# Patient Record
Sex: Female | Born: 1970 | Race: Black or African American | Hispanic: No | Marital: Married | Smoking: Former smoker
Health system: Southern US, Community
[De-identification: ages and names within clinical notes are randomized; demographics above are authoritative.]

## PROBLEM LIST (undated history)

## (undated) DIAGNOSIS — I10 Essential (primary) hypertension: Secondary | ICD-10-CM

---

## 2005-07-10 ENCOUNTER — Emergency Department (HOSPITAL_COMMUNITY): Admission: EM | Admit: 2005-07-10 | Discharge: 2005-07-10 | Payer: Self-pay | Admitting: Emergency Medicine

## 2005-09-10 ENCOUNTER — Emergency Department (HOSPITAL_COMMUNITY): Admission: EM | Admit: 2005-09-10 | Discharge: 2005-09-10 | Payer: Self-pay | Admitting: Emergency Medicine

## 2008-04-01 ENCOUNTER — Emergency Department (HOSPITAL_COMMUNITY): Admission: EM | Admit: 2008-04-01 | Discharge: 2008-04-01 | Payer: Self-pay | Admitting: *Deleted

## 2008-06-19 ENCOUNTER — Emergency Department (HOSPITAL_COMMUNITY): Admission: EM | Admit: 2008-06-19 | Discharge: 2008-06-19 | Payer: Self-pay | Admitting: Emergency Medicine

## 2008-08-01 ENCOUNTER — Observation Stay (HOSPITAL_COMMUNITY): Admission: EM | Admit: 2008-08-01 | Discharge: 2008-08-02 | Payer: Self-pay | Admitting: Emergency Medicine

## 2008-10-18 ENCOUNTER — Emergency Department (HOSPITAL_COMMUNITY): Admission: EM | Admit: 2008-10-18 | Discharge: 2008-10-18 | Payer: Self-pay | Admitting: Emergency Medicine

## 2008-12-14 ENCOUNTER — Emergency Department (HOSPITAL_COMMUNITY): Admission: EM | Admit: 2008-12-14 | Discharge: 2008-12-14 | Payer: Self-pay | Admitting: Emergency Medicine

## 2009-06-16 ENCOUNTER — Emergency Department (HOSPITAL_COMMUNITY): Admission: EM | Admit: 2009-06-16 | Discharge: 2009-06-16 | Payer: Self-pay | Admitting: Emergency Medicine

## 2009-06-18 ENCOUNTER — Emergency Department (HOSPITAL_COMMUNITY): Admission: EM | Admit: 2009-06-18 | Discharge: 2009-06-18 | Payer: Self-pay | Admitting: Emergency Medicine

## 2010-05-19 LAB — DIFFERENTIAL
Basophils Relative: 0 % (ref 0–1)
Eosinophils Absolute: 0.2 10*3/uL (ref 0.0–0.7)
Eosinophils Relative: 5 % (ref 0–5)
Lymphs Abs: 1.3 10*3/uL (ref 0.7–4.0)
Monocytes Absolute: 0.3 10*3/uL (ref 0.1–1.0)
Monocytes Relative: 7 % (ref 3–12)

## 2010-05-19 LAB — CBC
HCT: 35.8 % — ABNORMAL LOW (ref 36.0–46.0)
Hemoglobin: 11.7 g/dL — ABNORMAL LOW (ref 12.0–15.0)
MCHC: 32.5 g/dL (ref 30.0–36.0)
MCV: 84.3 fL (ref 78.0–100.0)
RBC: 4.25 MIL/uL (ref 3.87–5.11)
WBC: 4.2 10*3/uL (ref 4.0–10.5)

## 2010-05-19 LAB — POCT I-STAT, CHEM 8
Calcium, Ion: 1.15 mmol/L (ref 1.12–1.32)
Creatinine, Ser: 0.8 mg/dL (ref 0.4–1.2)
Glucose, Bld: 96 mg/dL (ref 70–99)
HCT: 38 % (ref 36.0–46.0)
Hemoglobin: 12.9 g/dL (ref 12.0–15.0)
Potassium: 3.6 mEq/L (ref 3.5–5.1)
TCO2: 26 mmol/L (ref 0–100)

## 2010-05-19 LAB — POCT CARDIAC MARKERS
CKMB, poc: 2 ng/mL (ref 1.0–8.0)
CKMB, poc: 2.2 ng/mL (ref 1.0–8.0)
Myoglobin, poc: 75.2 ng/mL (ref 12–200)

## 2010-05-20 LAB — GLUCOSE, CAPILLARY: Glucose-Capillary: 106 mg/dL — ABNORMAL HIGH (ref 70–99)

## 2010-05-27 LAB — CBC
MCHC: 34 g/dL (ref 30.0–36.0)
MCV: 85.3 fL (ref 78.0–100.0)
Platelets: 367 10*3/uL (ref 150–400)
RDW: 11.8 % (ref 11.5–15.5)

## 2010-05-27 LAB — URINALYSIS, ROUTINE W REFLEX MICROSCOPIC
Bilirubin Urine: NEGATIVE
Glucose, UA: NEGATIVE mg/dL
Nitrite: NEGATIVE
Specific Gravity, Urine: 1.022 (ref 1.005–1.030)
pH: 5.5 (ref 5.0–8.0)

## 2010-05-27 LAB — DIFFERENTIAL
Basophils Absolute: 0 10*3/uL (ref 0.0–0.1)
Basophils Relative: 0 % (ref 0–1)
Eosinophils Absolute: 0 10*3/uL (ref 0.0–0.7)
Neutrophils Relative %: 86 % — ABNORMAL HIGH (ref 43–77)

## 2010-05-27 LAB — URINE MICROSCOPIC-ADD ON

## 2010-05-27 LAB — BASIC METABOLIC PANEL
BUN: 6 mg/dL (ref 6–23)
CO2: 22 mEq/L (ref 19–32)
Chloride: 102 mEq/L (ref 96–112)
Creatinine, Ser: 1.02 mg/dL (ref 0.4–1.2)

## 2011-05-26 ENCOUNTER — Encounter (HOSPITAL_COMMUNITY): Payer: Self-pay | Admitting: *Deleted

## 2011-05-26 ENCOUNTER — Observation Stay (HOSPITAL_COMMUNITY)
Admission: EM | Admit: 2011-05-26 | Discharge: 2011-05-27 | Discharge status: Against Medical Advice | Payer: Self-pay | Source: Ambulatory Visit | Attending: Emergency Medicine | Admitting: Emergency Medicine

## 2011-05-26 ENCOUNTER — Emergency Department (HOSPITAL_COMMUNITY): Payer: Self-pay

## 2011-05-26 DIAGNOSIS — R0602 Shortness of breath: Secondary | ICD-10-CM | POA: Insufficient documentation

## 2011-05-26 DIAGNOSIS — I10 Essential (primary) hypertension: Secondary | ICD-10-CM | POA: Insufficient documentation

## 2011-05-26 DIAGNOSIS — R079 Chest pain, unspecified: Principal | ICD-10-CM | POA: Insufficient documentation

## 2011-05-26 HISTORY — DX: Essential (primary) hypertension: I10

## 2011-05-26 LAB — BASIC METABOLIC PANEL
GFR calc Af Amer: >90 mL/min (ref >90)
GFR calc non Af Amer: >90 mL/min (ref >90)
Glucose, Bld: 87 mg/dL (ref 70–99)
Potassium: 4.2 mEq/L (ref 3.5–5.1)
Sodium: 138 mEq/L (ref 135–145)

## 2011-05-26 LAB — POCT I-STAT TROPONIN I
Troponin i, poc: 0 ng/mL (ref 0.00–0.08)
Troponin i, poc: 0 ng/mL (ref 0.00–0.08)

## 2011-05-26 LAB — CBC
Hemoglobin: 10.4 g/dL — ABNORMAL LOW (ref 12.0–15.0)
MCHC: 31.3 g/dL (ref 30.0–36.0)
RDW: 18.9 % — ABNORMAL HIGH (ref 11.5–15.5)

## 2011-05-26 LAB — D-DIMER, QUANTITATIVE: D-Dimer, Quant: 0.65 ug/mL-FEU — ABNORMAL HIGH (ref 0.00–0.48)

## 2011-05-26 MED ORDER — NITROGLYCERIN 0.4 MG SL SUBL
0.4000 mg | SUBLINGUAL_TABLET | SUBLINGUAL | Status: DC | PRN
  Administered 2011-05-26: 0.4 mg via SUBLINGUAL

## 2011-05-26 MED ORDER — NITROGLYCERIN 0.4 MG SL SUBL
0.4000 mg | SUBLINGUAL_TABLET | SUBLINGUAL | Status: DC | PRN
  Administered 2011-05-26: 0.4 mg via SUBLINGUAL
  Filled 2011-05-26 (×2): qty 25

## 2011-05-26 MED ORDER — ACETAMINOPHEN 325 MG PO TABS: 650.0000 mg | ORAL_TABLET | Freq: Four times a day (QID) | ORAL | Status: DC | PRN

## 2011-05-26 MED ORDER — IOHEXOL 350 MG/ML SOLN: 80.0000 mL | Freq: Once | INTRAVENOUS | Status: AC | PRN

## 2011-05-26 MED ORDER — ZOLPIDEM TARTRATE 5 MG PO TABS
5.0000 mg | ORAL_TABLET | Freq: Every evening | ORAL | Status: DC | PRN
  Administered 2011-05-26: 5 mg via ORAL
  Filled 2011-05-26: qty 1

## 2011-05-26 MED ORDER — ACETAMINOPHEN 325 MG PO TABS
650.0000 mg | ORAL_TABLET | Freq: Once | ORAL | Status: AC
  Administered 2011-05-26: 650 mg via ORAL
  Filled 2011-05-26: qty 2

## 2011-05-26 MED ORDER — NITROGLYCERIN 2 % TD OINT
1.0000 [in_us] | TOPICAL_OINTMENT | Freq: Once | TRANSDERMAL | Status: AC
  Administered 2011-05-26: 1 [in_us] via TOPICAL
  Filled 2011-05-26: qty 1

## 2011-05-26 MED ORDER — ASPIRIN 81 MG PO CHEW
324.0000 mg | CHEWABLE_TABLET | Freq: Once | ORAL | Status: DC
  Filled 2011-05-26: qty 4

## 2011-05-26 NOTE — ED Provider Notes (Signed)
Medical screening examination/treatment/procedure(s) were conducted as a shared visit with non-physician practitioner(s) and myself.  I personally evaluated the patient during the encounter. Patient with onset of chest pain this morning. Patient reports she's been worked up in the past in [place] and was recommended to have a cath. In our notes she actually has had coronary CT done that was read as negative 3 years ago. Her d-dimer is slightly elevated we'll get CT angiogram chest. If it CT is negative we'll get stress test on cdu chest pain protocol. I do not feel patient is high risk for significant ACS  Olivia Mackie, MD 05/26/11 [phone]

## 2011-05-26 NOTE — ED Notes (Signed)
Pt c/o CP since 12:30 this evening after doing some house work then taking a shower.  Reports no recent illnesses but a higher level of stress.  No n/v, diaphoresis, cough.  Pain is in left chest and does not increase with palpation.  No SOB, but pain does worsen with inspiration.  Given 324 mg ASA and 3 sl nitro by EMS.

## 2011-05-26 NOTE — ED Notes (Signed)
Heart Healthy

## 2011-05-26 NOTE — Progress Notes (Signed)
Patient has been referred to the community liaison of [facility]) for medical resource assistance. This patient has also been discussed with the Medical Advisor.

## 2011-05-26 NOTE — ED Notes (Signed)
Patient transported to X-ray 

## 2011-05-26 NOTE — ED Provider Notes (Signed)
History     CSN: [id number]  Arrival date & time 05/26/11  0503  6:28 AM HPI Pt reports CP began about 3 hours ago. States it felt like angina. Describes it as left sided, and heavy. Reports she was getting ready for bed and had just laid down. Associated with SOB with exertion. Denies nausea, MI, PE. Currently 5-6/10; worst pain is 10/10. States pain has been constant for 3 hours. States her dr in [place] wanted her to have a cath but patient moved. Patient states she does not know why he wanted to do a cath. PCP DR. Avbuere.  Patient is a 41 y.o. female presenting with chest pain. The history is provided by the patient.  Chest Pain The chest pain began 3 - 5 hours ago. Chest pain occurs constantly. The chest pain is improving. At its most intense, the pain is at 10/10. The pain is currently at 5/10. The severity of the pain is severe. The quality of the pain is described as heavy. The pain does not radiate. Chest pain is worsened by deep breathing. Primary symptoms include shortness of breath. Pertinent negatives for primary symptoms include no fever, no fatigue, no cough, no wheezing, no abdominal pain, no nausea, no vomiting and no dizziness.  Pertinent negatives for associated symptoms include no claudication, no diaphoresis, no lower extremity edema, no near-syncope, no numbness, no orthopnea and no weakness. She tried nitroglycerin for the symptoms.  Her past medical history is significant for hypertension.  Pertinent negatives for past medical history include no CAD, no cancer, no COPD, no CHF, no DVT, no hyperlipidemia, no MI, no PE and no TIA.  Pertinent negatives for family medical history include: no early MI in family.  Procedure history is negative for cardiac catheterization and stress echo.     Past Medical History  Diagnosis Date  . Angina pectoris   . Hypertension     History reviewed. No pertinent past surgical history.  History reviewed. No pertinent family  history.  History  Substance Use Topics  . Smoking status: Not on file  . Smokeless tobacco: Not on file  . Alcohol Use:     OB History    Grav Para Term Preterm Abortions TAB SAB Ect Mult Living                  Review of Systems  Constitutional: Negative for fever, diaphoresis and fatigue.  HENT: Negative for neck pain.   Respiratory: Positive for shortness of breath. Negative for cough and wheezing.   Cardiovascular: Positive for chest pain. Negative for orthopnea, claudication, leg swelling and near-syncope.  Gastrointestinal: Negative for nausea, vomiting and abdominal pain.  Musculoskeletal: Negative for myalgias and back pain.  Neurological: Negative for dizziness, weakness, numbness and headaches.  All other systems reviewed and are negative.    Allergies  Review of patient's allergies indicates no known allergies.  Home Medications   Current Outpatient Rx  Name Route Sig Dispense Refill  . LISINOPRIL 10 MG PO TABS Oral Take 10 mg by mouth daily.    Marland Kitchen NITROGLYCERIN 0.4 MG SL SUBL Sublingual Place 0.4 mg under the tongue every 5 (five) minutes as needed.      BP 138/76  Pulse 85  Temp(Src) 97.8 F (36.6 C) (Oral)  Resp 18  SpO2 100%  Physical Exam  Vitals reviewed. Constitutional: She is oriented to person, place, and time. Vital signs are normal. She appears well-developed and well-nourished. No distress.  HENT:  Head: Normocephalic and  atraumatic.  Right Ear: External ear normal.  Left Ear: External ear normal.  Nose: Nose normal.  Mouth/Throat: Oropharynx is clear and moist. No oropharyngeal exudate.  Eyes: Conjunctivae are normal. Pupils are equal, round, and reactive to light.  Neck: Normal range of motion. Neck supple.  Cardiovascular: Normal rate, regular rhythm and normal heart sounds.  Exam reveals no gallop and no friction rub.   No murmur heard. Pulmonary/Chest: Effort normal and breath sounds normal. She has no wheezes. She has no rhonchi.  She has no rales. She exhibits no tenderness.  Abdominal: Soft. Bowel sounds are normal. She exhibits no distension and no mass. There is no tenderness. There is no rebound and no guarding.  Musculoskeletal: Normal range of motion.  Neurological: She is alert and oriented to person, place, and time. Coordination normal.  Skin: Skin is warm and dry. No rash noted. No erythema. No pallor.    ED Course  Procedures  Results for orders placed during the hospital encounter of 05/26/11  CBC      Component Value Range   WBC 4.6  4.0 - 10.5 (K/uL)   RBC 4.13  3.87 - 5.11 (MIL/uL)   Hemoglobin 10.4 (*) 12.0 - 15.0 (g/dL)   HCT 16.1 (*) 09.6 - 46.0 (%)   MCV 80.4  78.0 - 100.0 (fL)   MCH 25.2 (*) 26.0 - 34.0 (pg)   MCHC 31.3  30.0 - 36.0 (g/dL)   RDW 04.5 (*) 40.9 - 15.5 (%)   Platelets 154  150 - 400 (K/uL)  BASIC METABOLIC PANEL      Component Value Range   Sodium 138  135 - 145 (mEq/L)   Potassium 4.2  3.5 - 5.1 (mEq/L)   Chloride 105  96 - 112 (mEq/L)   CO2 23  19 - 32 (mEq/L)   Glucose, Bld 87  70 - 99 (mg/dL)   BUN 11  6 - 23 (mg/dL)   Creatinine, Ser 8.11  0.50 - 1.10 (mg/dL)   Calcium 9.1  8.4 - 91.4 (mg/dL)   GFR calc non Af Amer >90  >90 (mL/min)   GFR calc Af Amer >90  >90 (mL/min)  D-DIMER, QUANTITATIVE      Component Value Range   D-Dimer, Quant 0.65 (*) 0.00 - 0.48 (ug/mL-FEU)   Dg Chest 2 View  05/26/2011  *RADIOLOGY REPORT*  Clinical Data: Hypertension, chest pain.  CHEST - 2 VIEW  Comparison: 08/01/2008  Findings: Heart and mediastinal contours are within normal limits. No focal opacities or effusions.  No acute bony abnormality.  IMPRESSION: No active cardiopulmonary disease.  Original Report Authenticated By: Cyndie Chime, M.D.     Date: 05/26/2011  Rate: 84  Rhythm: normal sinus rhythm  QRS Axis: normal  Intervals: normal  ST/T Wave abnormalities: normal  Conduction Disutrbances: none  Old EKG Reviewed: No significant changes noted compared to  05/26/2011   MDM  8:05 AM Patient with have CTA to r/o PE due to elevated D-dimer. If normal patient will be placed on chest pain protocol. Discussed this with patient who agrees with plan. Discussed plan with Dr. Norlene Campbell and signed out Pascal Lux Wingen PA-C, who is in the CDU         Thomasene Lot, PA-C 05/26/11 7829

## 2011-05-26 NOTE — ED Notes (Signed)
Per EMS: pt with CP for 1.5 hours, occurred at rest.  Pain described as substernal pressure.  Given 3 sl nitro in route.  Pain worse with inspiration.  No SOB, n/v, diaphoresis.

## 2011-05-26 NOTE — ED Provider Notes (Signed)
3:56 PM Signout in CDU from [employer], PA-C. She originally presented this morning with chest pain, located substernally and left-sided. Initial troponin negative. She had an elevated d-dimer; CT angiogram was performed of the chest, which was negative for pulmonary embolus. Patient was moved to CDU on chest pain protocol. However, she was given nitro paste for her chest pain, so a stress echo cannot be performed for the next 6-8 hours, and by the time we are out of the "window," it will be too late in the day to perform the stress echo. She additionally cannot have a coronary CT today as she already had a CTA today. The current plan is to wait and perform stress echo in the morning.  Pt currently CP free, RRR, CTAB. She understands and agrees to plan.  7:00 PM Pt reassessed. Requesting medication for sleep. Medication ordered. Remains pain free.  10:02 PM Pt states that she has had return of chest pain. Pain is in slightly different distribution than initial; pain was initially more generalized across the left precordium but is currently located just under the left breast. She was given SL NTG and now states she's pain free. On exam, pain not reproducible to palpation, RRR, CTAB. She is in NAD, not diaphoretic. Repeat ECG was performed which is not remarkably changed from previous (reviewed with Dr. Preston Fleeting).   Date: 05/26/2011  Rate: 69  Rhythm: normal sinus rhythm  QRS Axis: normal  Intervals: normal  ST/T Wave abnormalities: normal  Conduction Disutrbances:none  Narrative Interpretation:   Old EKG Reviewed: no significant changes as compared with 5 am today  12:25 AM Pt currently sleeping, in NAD. Signout to Dr. Manus Gunning, PDA MD.  Grant Fontana, [place] 05/27/11 [phone]

## 2011-05-26 NOTE — ED Provider Notes (Signed)
10:00 AM Assumed care of patient in the CDU from [facility], PA-C.  Patient on chest pain protocol.  Patient comes in today with left sided anterior chest pain.  Initial troponin was negative.  Risk factors include HTN.  Patient had an elevated d-dimer.  CTA negative.  Patient will need to have a Stress Echocardiogram.  However, prior to coming to the CDU patient was given Nitro paste for her chest pain.  Stress Echo can not be done until the paste has been off for 6-8 hours.  The Stress Echo apparently can not be done after 3 pm.  The patient can not have a Coronary CT done at this time because she already had a CTA this morning.  Therefore, she will not be able to have further testing done until tomorrow morning.  Reassessed patient.  Patient not in any acute distress.  No complaints at this time.  VSS.  Heart RRR, Lungs CTAB.  2:00 PM Reassessed patient.  Patient is not in any acute distress.  VSS.  Patient reports that her chest pain and her headache have resolved.  Patient currently awaiting Coronary CT in the morning.  Pascal Lux Rosser, PA-C 05/26/11 1732  Pascal Lux Wingen, PA-C 05/27/11 0800

## 2011-05-26 NOTE — ED Notes (Signed)
Pt reports having left sided chest pain "under my breast" 4/10. Pt denies any shortness of breath or radiating pain. EKG repeat completed at bedside per P.A. Pt medicated with meds per MAR, pt INAD, MAE x4, skin w/d and resp e/u, will continue to monitor pt, VSS.

## 2011-05-26 NOTE — ED Notes (Signed)
Pt resting quietly with visitor at bedside. Pt denies any chest pain or complaints at this time, plan of care is updated with verbal understanding. Pt request sleep aid medications and P.A notified,will continue to monitor pt.

## 2011-05-26 NOTE — ED Notes (Signed)
Report received.  Pt transported back from xray, tolerated well.  Pt reports getting relief from chest pain, reports pain is 3/10.  Pt denies any shortness of breath at present.  PIV to L wrist is 18g, patent, saline locked.

## 2011-05-26 NOTE — ED Notes (Signed)
Pt denies any chest pain after nitroglycerin SL x 4. Will continue to monitor pt, pt inform to notify staff if pain returns with verbal understanding.

## 2011-05-27 ENCOUNTER — Other Ambulatory Visit (HOSPITAL_COMMUNITY): Payer: Self-pay

## 2011-05-27 LAB — BASIC METABOLIC PANEL
CO2: 22 mEq/L (ref 19–32)
Chloride: 105 mEq/L (ref 96–112)
Creatinine, Ser: 0.78 mg/dL (ref 0.50–1.10)
GFR calc Af Amer: >90 mL/min (ref >90)
Potassium: 3.6 mEq/L (ref 3.5–5.1)

## 2011-05-27 NOTE — ED Notes (Signed)
PT HAS RETURNED FROM ECHO LAB. PA IN TO SEE PT

## 2011-05-27 NOTE — ED Notes (Signed)
PT HAS RETURNED FROM STRESS TEST.. SHE HAS REPORTEDLY REFUSED TO DO THE TREADMILL . PA HAS BEEN IN AND TALKED WITH PT ABOUT HER TESTING/ REFUSAL AND OPTIONS FOR EVALUATION. PER PA PT HAS DECIDED ON NO FURTHER TESTING AND WANTS TO LEAVE AMA

## 2011-05-27 NOTE — ED Notes (Signed)
Pt to have stress echo around 0800 per echo lab. Pt informed.

## 2011-05-27 NOTE — ED Provider Notes (Signed)
Medical screening examination/treatment/procedure(s) were performed by non-physician practitioner and as supervising physician I was immediately available for consultation/collaboration.   Henery Betzold, MD 05/27/11 0108 

## 2011-05-27 NOTE — ED Notes (Signed)
Pt refusing stress test. Being brought back to cdu room. PA aware.

## 2011-05-27 NOTE — Discharge Instructions (Signed)
Read instructions below for reasons to return to the Emergency Department. It is recommended that your follow up with your Primary Care Doctor in regards to today's visit. If you do not have a doctor, use the resource guide listed below to help you find one. It is also important for you to follow up with a Cardiologist to have the chest pain further evaluated.  Chest pain can be very serious and can lead to heart attack and possible death.  Chest Pain (Nonspecific)  HOME CARE INSTRUCTIONS  For the next few days, avoid physical activities that bring on chest pain. Continue physical activities as directed.  Do not smoke cigarettes or drink alcohol until your symptoms are gone.  Only take over-the-counter or prescription medicine for pain, discomfort, or fever as directed by your caregiver.  Follow your caregiver's suggestions for further testing if your chest pain does not go away.  Keep any follow-up appointments you made. If you do not go to an appointment, you could develop lasting (chronic) problems with pain. If there is any problem keeping an appointment, you must call to reschedule.  SEEK MEDICAL CARE IF:  You think you are having problems from the medicine you are taking. Read your medicine instructions carefully.  Your chest pain does not go away, even after treatment.  You develop a rash with blisters on your chest.  SEEK IMMEDIATE MEDICAL CARE IF:  You have increased chest pain or pain that spreads to your arm, neck, jaw, back, or belly (abdomen).  You develop shortness of breath, an increasing cough, or you are coughing up blood.  You have severe back or abdominal pain, feel sick to your stomach (nauseous) or throw up (vomit).  You develop severe weakness, fainting, or chills.  You have an oral temperature above 102 F (38.9 C), not controlled by medicine.   THIS IS AN EMERGENCY. Do not wait to see if the pain will go away. Get medical help at once. Call your local emergency services  (911 in [place] Do not drive yourself to the hospital.   RESOURCE GUIDE  Dental Problems  Patients with Medicaid: [facility] [phone]                                           [id number] [employer] [address] Phone:  [phone]                                                  Phone:  [phone]  If unable to pay or uninsured, contact:  [facility] or [facility]. to become qualified for the adult dental clinic.  Chronic Pain Problems Contact [facility]  [phone] Patients need to be referred by their primary care doctor.  Insufficient Money for Medicine Contact [employer] Way:  call "211" or [facility] 574-485-5795.  No Primary Care Doctor Call [facility]  551-035-5854 Other agencies that provide inexpensive medical care    [facility]  [phone]    [facility]  [phone]    [facility]  385-630-7303  Women's Clinic  786-567-6706    [facility]  (952) 635-3439    [facility]  ([phone]  Psychological Services [facility]  [phone] [facility]  8788025484 [facility]   [phone] (emergency services ([phone])  Substance Abuse Resources [facility]  7783906786 [facility] (503)868-9092 [facility] (970) 391-9091 Floydene Flock 989-176-6023 Residential & Outpatient Substance Abuse Program  202-066-2985  Abuse/Neglect [facility] Child Abuse Hotline 616-231-7233 [facility] Child Abuse Hotline 864-421-8091 (After Hours)  Emergency Shelter [facility] 272-648-4135  Maternity Homes [facility] at [facility] [place] [facility] (321) 127-5579 [facility] (603)727-9594  MRSA Hotline #:   506-870-6592    [facility] Resources  [facility]     [employer]                          [facility] Dept. [address] [place]                       [address]      [address]  Blondell Reveal Phone:  [phone]                                   Phone:  [phone]                 Phone:  [phone]  [facility] Mental Health Phone:  [phone]  [facility] Child Abuse Hotline 707-240-8258 [phone] (After Hours)

## 2011-05-27 NOTE — ED Provider Notes (Signed)
7:05 AM Assumed care of patient in the CDU.  Patient is currently on Chest Pain Protocol.  She is awaiting a Stress Echocardiogram this morning.  Reassessed patient.  Patient is not having any chest pain at this time.  No complaints.  Alert and orientated x 3, Heart RRR, Lungs CTAB.  9:20 AM Patient returned from Stress Echo after refusing to have the test completed.  She states that she refuses to walk on a treadmill because she is concerned that she will have increased chest pain.  She is able to ambulate without difficulty.  Discussed with Dr. Estell Harpin.  He states that the patient will have to sign out AMA if she is refusing the Stress Echo.  Due to the fact that she is able to ambulate the Dobutamine Stress Test is unnecessary.  Patient explained the risks of leaving AMA without having the test completed including death.  She verbalized understanding and still wanted to leave.  Will discharge patient AMA.  Pascal Lux Ames, PA-C 05/27/11 [phone]

## 2011-05-27 NOTE — ED Notes (Signed)
Pt refused to sign ama paperwork. D/c instructions given.

## 2011-05-29 NOTE — ED Provider Notes (Signed)
Medical screening examination/treatment/procedure(s) were performed by non-physician practitioner and as supervising physician I was immediately available for consultation/collaboration.  Donnetta Hutching, MD 05/29/11 1421

## 2011-05-29 NOTE — ED Provider Notes (Signed)
Medical screening examination/treatment/procedure(s) were performed by non-physician practitioner and as supervising physician I was immediately available for consultation/collaboration.   Benny Lennert, MD 05/29/11 [phone]

## 2014-03-22 ENCOUNTER — Other Ambulatory Visit: Payer: Self-pay | Admitting: Internal Medicine

## 2014-03-22 DIAGNOSIS — Z1231 Encounter for screening mammogram for malignant neoplasm of breast: Secondary | ICD-10-CM

## 2014-03-29 ENCOUNTER — Ambulatory Visit
Admission: RE | Admit: 2014-03-29 | Discharge: 2014-03-29 | Disposition: A | Discharge status: Home / Self Care | Payer: Medicaid Other | Source: Ambulatory Visit | Attending: Internal Medicine | Admitting: Internal Medicine

## 2014-03-29 DIAGNOSIS — Z1231 Encounter for screening mammogram for malignant neoplasm of breast: Secondary | ICD-10-CM

## 2014-04-08 ENCOUNTER — Emergency Department (HOSPITAL_COMMUNITY)
Admission: EM | Admit: 2014-04-08 | Discharge: 2014-04-08 | Disposition: A | Discharge status: Home / Self Care | Payer: Medicaid Other | Attending: Emergency Medicine | Admitting: Emergency Medicine

## 2014-04-08 ENCOUNTER — Encounter (HOSPITAL_COMMUNITY): Payer: Self-pay | Admitting: *Deleted

## 2014-04-08 DIAGNOSIS — Z3202 Encounter for pregnancy test, result negative: Secondary | ICD-10-CM | POA: Diagnosis not present

## 2014-04-08 DIAGNOSIS — I10 Essential (primary) hypertension: Secondary | ICD-10-CM | POA: Insufficient documentation

## 2014-04-08 DIAGNOSIS — I209 Angina pectoris, unspecified: Secondary | ICD-10-CM | POA: Insufficient documentation

## 2014-04-08 DIAGNOSIS — R079 Chest pain, unspecified: Secondary | ICD-10-CM | POA: Insufficient documentation

## 2014-04-08 DIAGNOSIS — Z7982 Long term (current) use of aspirin: Secondary | ICD-10-CM | POA: Insufficient documentation

## 2014-04-08 DIAGNOSIS — D649 Anemia, unspecified: Secondary | ICD-10-CM

## 2014-04-08 DIAGNOSIS — Z720 Tobacco use: Secondary | ICD-10-CM | POA: Insufficient documentation

## 2014-04-08 LAB — CBC WITH DIFFERENTIAL/PLATELET
Basophils Absolute: 0 10*3/uL (ref 0.0–0.1)
Basophils Relative: 1 % (ref 0–1)
EOS ABS: 0.2 10*3/uL (ref 0.0–0.7)
Eosinophils Relative: 4 % (ref 0–5)
HCT: 29.2 % — ABNORMAL LOW (ref 36.0–46.0)
HEMOGLOBIN: 9.2 g/dL — AB (ref 12.0–15.0)
LYMPHS ABS: 1.9 10*3/uL (ref 0.7–4.0)
Lymphocytes Relative: 38 % (ref 12–46)
MCH: 23.3 pg — AB (ref 26.0–34.0)
MCHC: 31.5 g/dL (ref 30.0–36.0)
MCV: 73.9 fL — AB (ref 78.0–100.0)
Monocytes Absolute: 0.4 10*3/uL (ref 0.1–1.0)
Monocytes Relative: 7 % (ref 3–12)
NEUTROS ABS: 2.5 10*3/uL (ref 1.7–7.7)
NEUTROS PCT: 50 % (ref 43–77)
PLATELETS: 398 10*3/uL (ref 150–400)
RBC: 3.95 MIL/uL (ref 3.87–5.11)
RDW: 20.1 % — ABNORMAL HIGH (ref 11.5–15.5)
WBC: 5 10*3/uL (ref 4.0–10.5)

## 2014-04-08 LAB — BASIC METABOLIC PANEL
Anion gap: 8 (ref 5–15)
BUN: 10 mg/dL (ref 6–23)
CO2: 27 mmol/L (ref 19–32)
CREATININE: 0.83 mg/dL (ref 0.50–1.10)
Calcium: 9.1 mg/dL (ref 8.4–10.5)
Chloride: 102 mmol/L (ref 96–112)
GFR calc non Af Amer: 86 mL/min — ABNORMAL LOW (ref >90)
Glucose, Bld: 105 mg/dL — ABNORMAL HIGH (ref 70–99)
POTASSIUM: 3.3 mmol/L — AB (ref 3.5–5.1)
SODIUM: 137 mmol/L (ref 135–145)

## 2014-04-08 LAB — POC URINE PREG, ED: Preg Test, Ur: NEGATIVE

## 2014-04-08 LAB — TROPONIN I: Troponin I: <0.03 ng/mL (ref <0.031)

## 2014-04-08 MED ORDER — ASPIRIN 81 MG PO CHEW
243.0000 mg | CHEWABLE_TABLET | Freq: Once | ORAL | Status: AC
  Administered 2014-04-08: 243 mg via ORAL
  Filled 2014-04-08: qty 3

## 2014-04-08 MED ORDER — POTASSIUM CHLORIDE CRYS ER 20 MEQ PO TBCR
40.0000 meq | EXTENDED_RELEASE_TABLET | Freq: Once | ORAL | Status: AC
  Administered 2014-04-08: 40 meq via ORAL
  Filled 2014-04-08: qty 2

## 2014-04-08 MED ORDER — FERROUS SULFATE 325 (65 FE) MG PO TABS: 325.0000 mg | ORAL_TABLET | Freq: Every day | ORAL | Status: AC

## 2014-04-08 NOTE — ED Provider Notes (Signed)
CSN: [id number]     Arrival date & time 04/08/14  1902 History   First MD Initiated Contact with Patient 04/08/14 2032     Chief Complaint  Patient presents with  . Chest Pain     (Consider location/radiation/quality/duration/timing/severity/associated sxs/prior Treatment) HPI  Carla Powell is a 44 y.o. female with PMH of angina pectoris, hypertension presenting with chest pain that started at 11 AM this morning that is sharp left-sided and radiates to left elbow. Patient stated she was at rest and the chest pain has continued after nitroglycerin. Patient endorses mild shortness of breath at onset that is now resolved. No nausea or vomiting or diaphoresis. Patient states movement makes it worse. Better with rest. Patient has 3 episodes of chest pain every week. She saw her cardiologist last week and has a stress test scheduled for tomorrow. Patient without history of dyslipidemia, diabetes, family history of sudden cardiac death. Pt denies history of DVT, PE, recent surgery or trauma, malignancy, hemoptysis, exogenous estrogen use, unilateral leg swelling or tenderness, immobilization.    Past Medical History  Diagnosis Date  . Angina pectoris   . Hypertension    Past Surgical History  Procedure Laterality Date  . Cesarean section     No family history on file. History  Substance Use Topics  . Smoking status: Current Every Day Smoker  . Smokeless tobacco: Never Used  . Alcohol Use: No   OB History    No data available     Review of Systems 10 Systems reviewed and are negative for acute change except as noted in the HPI.    Allergies  Review of patient's allergies indicates no known allergies.  Home Medications   Prior to Admission medications   Medication Sig Start Date End Date Taking? Authorizing Provider  aspirin 81 MG EC tablet Take 81 mg by mouth daily. 03/22/14  Yes Historical Provider, MD  NAPROXEN DR 500 MG EC tablet Take 500 mg by mouth daily as needed  (pain).  03/22/14  Yes Historical Provider, MD  nitroGLYCERIN (NITROSTAT) 0.4 MG SL tablet Place 0.4 mg under the tongue every 5 (five) minutes as needed for chest pain.    Yes Historical Provider, MD  ferrous sulfate 325 (65 FE) MG tablet Take 1 tablet (325 mg total) by mouth daily. 04/08/14   Pura Spice, PA-C   BP 144/85 mmHg  Pulse 78  Temp(Src) 98.4 F (36.9 C) (Oral)  Resp 16  Ht 5' 5"  (1.651 m)  Wt 178 lb (80.74 kg)  BMI 29.62 kg/m2  SpO2 99%  LMP 03/28/2014 Physical Exam  Constitutional: She appears well-developed and well-nourished. No distress.  HENT:  Head: Normocephalic and atraumatic.  Eyes: Conjunctivae and EOM are normal. Right eye exhibits no discharge. Left eye exhibits no discharge.  Neck: No JVD present.  Cardiovascular: Normal rate and regular rhythm.   No leg swelling or tenderness. Negative Homan's sign.  Pulmonary/Chest: Effort normal and breath sounds normal. No respiratory distress. She has no wheezes.  Abdominal: Soft. Bowel sounds are normal. She exhibits no distension. There is no tenderness.  Neurological: She is alert. She exhibits normal muscle tone. Coordination normal.  Skin: Skin is warm and dry. She is not diaphoretic.  Nursing note and vitals reviewed.   ED Course  Procedures (including critical care time) Labs Review Labs Reviewed  CBC WITH DIFFERENTIAL/PLATELET - Abnormal; Notable for the following:    Hemoglobin 9.2 (*)    HCT 29.2 (*)    MCV 73.9 (*)  MCH 23.3 (*)    RDW 20.1 (*)    All other components within normal limits  BASIC METABOLIC PANEL - Abnormal; Notable for the following:    Potassium 3.3 (*)    Glucose, Bld 105 (*)    GFR calc non Af Amer 86 (*)    All other components within normal limits  TROPONIN I  TROPONIN I  POC URINE PREG, ED    Imaging Review No results found.   EKG Interpretation   Date/Time:  Sunday April 08 2014 21:19:35 EST Ventricular Rate:  73 PR Interval:  156 QRS Duration: 77 QT  Interval:  371 QTC Calculation: 409 R Axis:   38 Text Interpretation:  Sinus rhythm Biatrial enlargement ST abnormality in  avL - ST abnormalities resolved. Abnormal ekg Confirmed by MILLER  MD,  BRIAN ([id number]) on 04/08/2014 9:23:16 PM      MDM   Final diagnoses:  Chest pain, unspecified chest pain type  Anemia, unspecified anemia type   I doubt acute cardiac or pulmonary etiology. Cardiac risk factors include hypertension. Patient has had constant pain over 10 hours without improvement with nitroglycerin. It is not exertional. Pt has three episodes CP weekly. VSS. Patient without peripheral edema, JVD, regular rate and rhythm and lungs clear to auscultation bilaterally. EKG without acute abnormalities and initial troponin negative. Delta troponin negative. Repeat EKG without acute changes. Patient hypokalemia. Potassium supplementation providing ED. I doubt related to patient's symptoms. Patient also anemic and will be given iron supplementation. Pt with report of heavy menses. Likely chronic anemia. Patient saw cardiology last week with stress test scheduled tomorrow. Patient is afebrile, nontoxic, and in no acute distress. Patient is appropriate for outpatient management and is stable for discharge.  Discussed return precautions with patient. Discussed all results and patient verbalizes understanding and agrees with plan.  This is a shared patient. This patient was discussed with the physician who saw and evaluated the patient and agrees with the plan.   Pura Spice, PA-C 04/08/14 2251  Johnna Acosta, MD 04/09/14 [phone]

## 2014-04-08 NOTE — ED Provider Notes (Signed)
The patient is a 44 year old female, she has a history of intermittent chest pain, she states this happens about 3 days per week, she is currently under the care of her family doctor who has prescribed her nitroglycerin, she states this usually takes care of her pain immediately. The pain usually comes on arrest, she is able to exercise by walking down the street and back and never has any chest pain with that, she has no shortness of breath at this time, no swelling of the legs, no fevers chills nausea or vomiting. She has not been coughing. She reports that the chest pain started this morning, it has been present for approximately 10 ongoing hours without relief, it did not respond to nitroglycerin. She feels it is in the same location as her prior pain, she has no other associated symptoms.  On exam the patient has clear heart and lung sounds, no peripheral edema, normal vital signs except for mild hypertension. EKG shows no acute findings, initial troponin normal, we'll perform a delta troponin and EKG, she does have a scheduled stress test in the morning.   EKG Interpretation  Date/Time:  Sunday April 08 2014 21:19:35 EST Ventricular Rate:  73 PR Interval:  156 QRS Duration: 77 QT Interval:  371 QTC Calculation: 409 R Axis:   38 Text Interpretation:  Sinus rhythm Biatrial enlargement ST abnormality in avL - ST abnormalities resolved. Abnormal ekg Confirmed by Sabra Heck  MD, [place] ([id number]) on 04/08/2014 9:23:16 PM      Medical screening examination/treatment/procedure(s) were conducted as a shared visit with non-physician practitioner(s) and myself.  I personally evaluated the patient during the encounter.  Clinical Impression:   Final diagnoses:  Chest pain, unspecified chest pain type  Anemia, unspecified anemia type         Johnna Acosta, MD 04/09/14 2036

## 2014-04-08 NOTE — Discharge Instructions (Signed)
Return to the emergency room with worsening of symptoms, new symptoms or with symptoms that are concerning , especially chest pain that feels like a pressure, spreads to left arm or jaw, worse with exertion, associated with nausea, vomiting, shortness of breath and/or sweating.  Call you cardiologist and speak with them about today's chest pain and follow up. Go to your stress test as scheduled. Start taking iron supplementation. If you develop nausea vomiting, constipation or dark stools or intolerance try taking every other day. Please call your doctor for a followup appointment within 24-48 hours. When you talk to your doctor please let them know that you were seen in the emergency department and have them acquire all of your records so that they can discuss the findings with you and formulate a treatment plan to fully care for your new and ongoing problems.  Read below information and follow recommendations.  Anemia, Nonspecific Anemia is a condition in which the concentration of red blood cells or hemoglobin in the blood is below normal. Hemoglobin is a substance in red blood cells that carries oxygen to the tissues of the body. Anemia results in not enough oxygen reaching these tissues.  CAUSES  Common causes of anemia include:   Excessive bleeding. Bleeding may be internal or external. This includes excessive bleeding from periods (in women) or from the intestine.   Poor nutrition.   Chronic kidney, thyroid, and liver disease.  Bone marrow disorders that decrease red blood cell production.  Cancer and treatments for cancer.  HIV, AIDS, and their treatments.  Spleen problems that increase red blood cell destruction.  Blood disorders.  Excess destruction of red blood cells due to infection, medicines, and autoimmune disorders. SIGNS AND SYMPTOMS   Minor weakness.   Dizziness.   Headache.  Palpitations.   Shortness of breath, especially with exercise.    Paleness.  Cold sensitivity.  Indigestion.  Nausea.  Difficulty sleeping.  Difficulty concentrating. Symptoms may occur suddenly or they may develop slowly.  DIAGNOSIS  Additional blood tests are often needed. These help your health care provider determine the best treatment. Your health care provider will check your stool for blood and look for other causes of blood loss.  TREATMENT  Treatment varies depending on the cause of the anemia. Treatment can include:   Supplements of iron, vitamin X83, or folic acid.   Hormone medicines.   A blood transfusion. This may be needed if blood loss is severe.   Hospitalization. This may be needed if there is significant continual blood loss.   Dietary changes.  Spleen removal. HOME CARE INSTRUCTIONS Keep all follow-up appointments. It often takes many weeks to correct anemia, and having your health care provider check on your condition and your response to treatment is very important. SEEK IMMEDIATE MEDICAL CARE IF:   You develop extreme weakness, shortness of breath, or chest pain.   You become dizzy or have trouble concentrating.  You develop heavy vaginal bleeding.   You develop a rash.   You have bloody or black, tarry stools.   You faint.   You vomit up blood.   You vomit repeatedly.   You have abdominal pain.  You have a fever or persistent symptoms for more than 2-3 days.   You have a fever and your symptoms suddenly get worse.   You are dehydrated.  MAKE SURE YOU:  Understand these instructions.  Will watch your condition.  Will get help right away if you are not doing well or get  worse. Document Released: 03/05/2004 Document Revised: 09/28/2012 Document Reviewed: 07/22/2012 [facility] Patient Information 2015 [place], [place]. This information is not intended to replace advice given to you by your health care provider. Make sure you discuss any questions you have with your health care  provider.  Chest Pain (Nonspecific) It is often hard to give a specific diagnosis for the cause of chest pain. There is always a chance that your pain could be related to something serious, such as a heart attack or a blood clot in the lungs. You need to follow up with your health care provider for further evaluation. CAUSES   Heartburn.  Pneumonia or bronchitis.  Anxiety or stress.  Inflammation around your heart (pericarditis) or lung (pleuritis or pleurisy).  A blood clot in the lung.  A collapsed lung (pneumothorax). It can develop suddenly on its own (spontaneous pneumothorax) or from trauma to the chest.  Shingles infection (herpes zoster virus). The chest wall is composed of bones, muscles, and cartilage. Any of these can be the source of the pain.  The bones can be bruised by injury.  The muscles or cartilage can be strained by coughing or overwork.  The cartilage can be affected by inflammation and become sore (costochondritis). DIAGNOSIS  Lab tests or other studies may be needed to find the cause of your pain. Your health care provider may have you take a test called an ambulatory electrocardiogram (ECG). An ECG records your heartbeat patterns over a 24-hour period. You may also have other tests, such as:  Transthoracic echocardiogram (TTE). During echocardiography, sound waves are used to evaluate how blood flows through your heart.  Transesophageal echocardiogram (TEE).  Cardiac monitoring. This allows your health care provider to monitor your heart rate and rhythm in real time.  Holter monitor. This is a portable device that records your heartbeat and can help diagnose heart arrhythmias. It allows your health care provider to track your heart activity for several days, if needed.  Stress tests by exercise or by giving medicine that makes the heart beat faster. TREATMENT   Treatment depends on what may be causing your chest pain. Treatment may include:  Acid  blockers for heartburn.  Anti-inflammatory medicine.  Pain medicine for inflammatory conditions.  Antibiotics if an infection is present.  You may be advised to change lifestyle habits. This includes stopping smoking and avoiding alcohol, caffeine, and chocolate.  You may be advised to keep your head raised (elevated) when sleeping. This reduces the chance of acid going backward from your stomach into your esophagus. Most of the time, nonspecific chest pain will improve within 2-3 days with rest and mild pain medicine.  HOME CARE INSTRUCTIONS   If antibiotics were prescribed, take them as directed. Finish them even if you start to feel better.  For the next few days, avoid physical activities that bring on chest pain. Continue physical activities as directed.  Do not use any tobacco products, including cigarettes, chewing tobacco, or electronic cigarettes.  Avoid drinking alcohol.  Only take medicine as directed by your health care provider.  Follow your health care provider's suggestions for further testing if your chest pain does not go away.  Keep any follow-up appointments you made. If you do not go to an appointment, you could develop lasting (chronic) problems with pain. If there is any problem keeping an appointment, call to reschedule. SEEK MEDICAL CARE IF:   Your chest pain does not go away, even after treatment.  You have a rash with blisters  on your chest.  You have a fever. SEEK IMMEDIATE MEDICAL CARE IF:   You have increased chest pain or pain that spreads to your arm, neck, jaw, back, or abdomen.  You have shortness of breath.  You have an increasing cough, or you cough up blood.  You have severe back or abdominal pain.  You feel nauseous or vomit.  You have severe weakness.  You faint.  You have chills. This is an emergency. Do not wait to see if the pain will go away. Get medical help at once. Call your local emergency services (911 in [place] Do not  drive yourself to the hospital. MAKE SURE YOU:   Understand these instructions.  Will watch your condition.  Will get help right away if you are not doing well or get worse. Document Released: 11/05/2004 Document Revised: 01/31/2013 Document Reviewed: 09/01/2007 [facility] Patient Information 2015 [place], [place]. This information is not intended to replace advice given to you by your health care provider. Make sure you discuss any questions you have with your health care provider.

## 2014-04-08 NOTE — ED Notes (Signed)
Patient presents stating she started with CP  Stated she was visiting at [facility] and started with this CP

## 2014-04-08 NOTE — ED Notes (Signed)
PA Tori at bedside.

## 2014-04-14 ENCOUNTER — Emergency Department (HOSPITAL_COMMUNITY)
Admission: EM | Admit: 2014-04-14 | Discharge: 2014-04-14 | Disposition: A | Discharge status: Home / Self Care | Payer: Medicaid Other | Attending: Emergency Medicine | Admitting: Emergency Medicine

## 2014-04-14 ENCOUNTER — Encounter (HOSPITAL_COMMUNITY): Payer: Self-pay | Admitting: Physical Medicine and Rehabilitation

## 2014-04-14 DIAGNOSIS — Z79899 Other long term (current) drug therapy: Secondary | ICD-10-CM | POA: Diagnosis not present

## 2014-04-14 DIAGNOSIS — K088 Other specified disorders of teeth and supporting structures: Secondary | ICD-10-CM | POA: Insufficient documentation

## 2014-04-14 DIAGNOSIS — K0381 Cracked tooth: Secondary | ICD-10-CM | POA: Diagnosis not present

## 2014-04-14 DIAGNOSIS — Z87891 Personal history of nicotine dependence: Secondary | ICD-10-CM | POA: Diagnosis not present

## 2014-04-14 DIAGNOSIS — Z7982 Long term (current) use of aspirin: Secondary | ICD-10-CM | POA: Diagnosis not present

## 2014-04-14 DIAGNOSIS — I209 Angina pectoris, unspecified: Secondary | ICD-10-CM | POA: Diagnosis not present

## 2014-04-14 DIAGNOSIS — I10 Essential (primary) hypertension: Secondary | ICD-10-CM | POA: Insufficient documentation

## 2014-04-14 DIAGNOSIS — K0889 Other specified disorders of teeth and supporting structures: Secondary | ICD-10-CM

## 2014-04-14 MED ORDER — PENICILLIN V POTASSIUM 250 MG PO TABS: 250.0000 mg | ORAL_TABLET | Freq: Four times a day (QID) | ORAL | Status: AC

## 2014-04-14 MED ORDER — HYDROCODONE-ACETAMINOPHEN 5-325 MG PO TABS: 1.0000 | ORAL_TABLET | Freq: Four times a day (QID) | ORAL | Status: DC | PRN

## 2014-04-14 NOTE — ED Notes (Signed)
Pt states L upper molar pain. Ongoing for several days. 10/10 pain upon arrival to ED. NAD.

## 2014-04-14 NOTE — ED Notes (Signed)
Declined W/C at D/C and was escorted to lobby by RN. 

## 2014-04-14 NOTE — Discharge Instructions (Signed)
Follow up with your dentist as discussed Dental Pain A tooth ache may be caused by cavities (tooth decay). Cavities expose the nerve of the tooth to air and hot or cold temperatures. It may come from an infection or abscess (also called a boil or furuncle) around your tooth. It is also often caused by dental caries (tooth decay). This causes the pain you are having. DIAGNOSIS  Your caregiver can diagnose this problem by exam. TREATMENT   If caused by an infection, it may be treated with medications which kill germs (antibiotics) and pain medications as prescribed by your caregiver. Take medications as directed.  Only take over-the-counter or prescription medicines for pain, discomfort, or fever as directed by your caregiver.  Whether the tooth ache today is caused by infection or dental disease, you should see your dentist as soon as possible for further care. SEEK MEDICAL CARE IF: The exam and treatment you received today has been provided on an emergency basis only. This is not a substitute for complete medical or dental care. If your problem worsens or new problems (symptoms) appear, and you are unable to meet with your dentist, call or return to this location. SEEK IMMEDIATE MEDICAL CARE IF:   You have a fever.  You develop redness and swelling of your face, jaw, or neck.  You are unable to open your mouth.  You have severe pain uncontrolled by pain medicine. MAKE SURE YOU:   Understand these instructions.  Will watch your condition.  Will get help right away if you are not doing well or get worse. Document Released: 01/26/2005 Document Revised: 04/20/2011 Document Reviewed: 09/14/2007 [facility] Patient Information 2015 [place], [place]. This information is not intended to replace advice given to you by your health care provider. Make sure you discuss any questions you have with your health care provider.

## 2014-04-14 NOTE — ED Provider Notes (Signed)
CSN: [id number]     Arrival date & time 04/14/14  1032 History  This chart was scribed for Carla Docker, NP, working with Shaune Pollack, MD by Steva Colder, ED Scribe. The patient was seen in room TR06C/TR06C at 10:57 AM.    Chief Complaint  Patient presents with  . Dental Pain      The history is provided by the patient. No language interpreter was used.    HPI Comments: Carla Powell is a 44 y.o. female with a medical hx of HTN who presents to the Emergency Department complaining of left upper dental pain onset this morning. Pt thinks that there is a nerve that is exposed. Pt notes that her tooth broke 3 weeks ago and she does have a dentist and wont be able to see him until 05/11/14. Pt just recently took a stress test Monday and she has another test on 04/24/14. Pt PCP didn't want to Rx her HTN medications until all her test result. She denies fever, facial swelling, and any other symptoms. Pt denies any allergies.    Past Medical History  Diagnosis Date  . Angina pectoris   . Hypertension    Past Surgical History  Procedure Laterality Date  . Cesarean section     No family history on file. History  Substance Use Topics  . Smoking status: Former Research scientist (life sciences)  . Smokeless tobacco: Never Used  . Alcohol Use: No   OB History    No data available     Review of Systems  Constitutional: Negative for fever.  HENT: Positive for dental problem. Negative for facial swelling.       Allergies  Review of patient's allergies indicates no known allergies.  Home Medications   Prior to Admission medications   Medication Sig Start Date End Date Taking? Authorizing Provider  aspirin 81 MG EC tablet Take 81 mg by mouth daily. 03/22/14   Historical Provider, MD  ferrous sulfate 325 (65 FE) MG tablet Take 1 tablet (325 mg total) by mouth daily. 04/08/14   Pura Spice, PA-C  NAPROXEN DR 500 MG EC tablet Take 500 mg by mouth daily as needed (pain).  03/22/14   Historical Provider, MD   nitroGLYCERIN (NITROSTAT) 0.4 MG SL tablet Place 0.4 mg under the tongue every 5 (five) minutes as needed for chest pain.     Historical Provider, MD   BP 185/105 mmHg  Pulse 85  Temp(Src) 98.3 F (36.8 C) (Oral)  Resp 16  Ht 5' 5"  (1.651 m)  Wt 178 lb (80.74 kg)  BMI 29.62 kg/m2  SpO2 100%  LMP 03/28/2014  Physical Exam  Constitutional: She is oriented to person, place, and time. She appears well-developed and well-nourished. No distress.  HENT:  Head: Normocephalic and atraumatic.  Right Ear: External ear normal.  Left Ear: External ear normal.  Mouth/Throat:    Eyes: EOM are normal.  Neck: Neck supple. No tracheal deviation present.  Cardiovascular: Normal rate.   Pulmonary/Chest: Effort normal. No respiratory distress.  Musculoskeletal: Normal range of motion.  Neurological: She is alert and oriented to person, place, and time.  Skin: Skin is warm and dry.  Psychiatric: She has a normal mood and affect. Her behavior is normal.  Nursing note and vitals reviewed.   ED Course  Procedures (including critical care time) DIAGNOSTIC STUDIES: Oxygen Saturation is 100% on RA, normal by my interpretation.    COORDINATION OF CARE: 11:00 AM-Discussed treatment plan which includes abx, pain medication, f/u with dentist  with pt at bedside and pt agreed to plan.   Labs Review Labs Reviewed - No data to display  Imaging Review No results found.   EKG Interpretation None      MDM   Final diagnoses:  Toothache    Will treat for tooth infection. No definite abscess noted. Pt has appointment with dentist april1: discussed bp with pt. She is supposed to see pcp next week  I personally performed the services described in this documentation, which was scribed in my presence. The recorded information has been reviewed and is accurate.    Carla Docker, NP 04/14/14 [place], NP 04/14/14 [place] Ray, MD 04/14/14 [phone]

## 2015-05-10 LAB — GLUCOSE, POCT (MANUAL RESULT ENTRY): POC Glucose: 105 mg/dl — AB (ref 70–99)

## 2016-07-25 ENCOUNTER — Emergency Department (HOSPITAL_COMMUNITY): Payer: Medicaid Other

## 2016-07-25 ENCOUNTER — Encounter (HOSPITAL_COMMUNITY): Payer: Self-pay

## 2016-07-25 ENCOUNTER — Emergency Department (HOSPITAL_COMMUNITY)
Admission: EM | Admit: 2016-07-25 | Discharge: 2016-07-25 | Disposition: A | Discharge status: Home / Self Care | Payer: Medicaid Other | Attending: Emergency Medicine | Admitting: Emergency Medicine

## 2016-07-25 DIAGNOSIS — I10 Essential (primary) hypertension: Secondary | ICD-10-CM | POA: Diagnosis not present

## 2016-07-25 DIAGNOSIS — Z79899 Other long term (current) drug therapy: Secondary | ICD-10-CM | POA: Insufficient documentation

## 2016-07-25 DIAGNOSIS — D649 Anemia, unspecified: Secondary | ICD-10-CM

## 2016-07-25 DIAGNOSIS — R079 Chest pain, unspecified: Secondary | ICD-10-CM | POA: Diagnosis present

## 2016-07-25 DIAGNOSIS — R0789 Other chest pain: Secondary | ICD-10-CM | POA: Diagnosis not present

## 2016-07-25 DIAGNOSIS — Z87891 Personal history of nicotine dependence: Secondary | ICD-10-CM | POA: Diagnosis not present

## 2016-07-25 DIAGNOSIS — Z7982 Long term (current) use of aspirin: Secondary | ICD-10-CM | POA: Insufficient documentation

## 2016-07-25 LAB — I-STAT TROPONIN, ED: Troponin i, poc: 0 ng/mL (ref 0.00–0.08)

## 2016-07-25 LAB — BASIC METABOLIC PANEL
Anion gap: 8 (ref 5–15)
BUN: 7 mg/dL (ref 6–20)
CALCIUM: 9.1 mg/dL (ref 8.9–10.3)
CO2: 24 mmol/L (ref 22–32)
CREATININE: 0.83 mg/dL (ref 0.44–1.00)
Chloride: 106 mmol/L (ref 101–111)
GFR calc non Af Amer: >60 mL/min (ref >60)
GLUCOSE: 106 mg/dL — AB (ref 65–99)
Potassium: 3.5 mmol/L (ref 3.5–5.1)
Sodium: 138 mmol/L (ref 135–145)

## 2016-07-25 LAB — CBC
HCT: 27.9 % — ABNORMAL LOW (ref 36.0–46.0)
Hemoglobin: 8.2 g/dL — ABNORMAL LOW (ref 12.0–15.0)
MCH: 20 pg — ABNORMAL LOW (ref 26.0–34.0)
MCHC: 29.4 g/dL — ABNORMAL LOW (ref 30.0–36.0)
MCV: 68.2 fL — ABNORMAL LOW (ref 78.0–100.0)
Platelets: 219 10*3/uL (ref 150–400)
RBC: 4.09 MIL/uL (ref 3.87–5.11)
RDW: 20.9 % — AB (ref 11.5–15.5)
WBC: 4 10*3/uL (ref 4.0–10.5)

## 2016-07-25 MED ORDER — IOPAMIDOL (ISOVUE-370) INJECTION 76%
INTRAVENOUS | Status: AC
  Administered 2016-07-25: 100 mL via INTRAVENOUS
  Filled 2016-07-25: qty 100

## 2016-07-25 MED ORDER — MORPHINE SULFATE (PF) 4 MG/ML IV SOLN
4.0000 mg | Freq: Once | INTRAVENOUS | Status: AC
Start: 2016-07-25 — End: 2016-07-25
  Administered 2016-07-25: 4 mg via INTRAVENOUS
  Filled 2016-07-25: qty 1

## 2016-07-25 MED ORDER — CYCLOBENZAPRINE HCL 5 MG PO TABS: 5.0000 mg | ORAL_TABLET | Freq: Three times a day (TID) | ORAL | 0 refills | Status: DC | PRN

## 2016-07-25 MED ORDER — SODIUM CHLORIDE 0.9 % IV BOLUS (SEPSIS)
1000.0000 mL | Freq: Once | INTRAVENOUS | Status: AC
  Administered 2016-07-25: 1000 mL via INTRAVENOUS

## 2016-07-25 MED ORDER — CYCLOBENZAPRINE HCL 10 MG PO TABS
5.0000 mg | ORAL_TABLET | Freq: Once | ORAL | Status: AC
  Administered 2016-07-25: 5 mg via ORAL
  Filled 2016-07-25: qty 1

## 2016-07-25 NOTE — Discharge Instructions (Signed)
Take tylenol for pain.   Take flexeril for muscle spasms.   Your hemoglobin is 8.2, slightly low. Recheck with your doctor in a week. This is likely from your menstrual bleeding recently.   See your doctor in a week   Return to ER if you have worse chest pain, trouble breathing, passing out.

## 2016-07-25 NOTE — ED Triage Notes (Signed)
Pt complaining of L sided chest pain x 30 mins. Pt states hx of same. Pt also complaining of SOB.

## 2016-07-25 NOTE — ED Notes (Signed)
Pt family member has brought something for pt to eat.RN Myriam Jacobson informed. Pt a little unsteady on her feet while assisting her to the rest room. RN also informed.

## 2016-07-25 NOTE — ED Provider Notes (Signed)
[facility] DEPT Provider Note   CSN: [id number] Arrival date & time: 07/25/16  1507     History   Chief Complaint Chief Complaint  Patient presents with  . Chest Pain    HPI Carla Powell is a 46 y.o. female hx of HTN, Here presenting with chest pain. Acute onset of substernal chest pain around 2 PM today. She states that it's worse when she takes a deep breath and when she moves around. She denies any fall or injury or muscle strain prior to this. Denies any abdominal pain or leg pain. Denies any recent travel or history of blood clots or history of coronary artery disease. She did not take any meds prior to arrival.  The history is provided by the patient.    Past Medical History:  Diagnosis Date  . Angina pectoris   . Hypertension     There are no active problems to display for this patient.   Past Surgical History:  Procedure Laterality Date  . CESAREAN SECTION      OB History    No data available       Home Medications    Prior to Admission medications   Medication Sig Start Date End Date Taking? Authorizing Provider  Ascorbic Acid (VITA-C PO) Take 1 tablet by mouth daily.   Yes [provider]  aspirin 81 MG EC tablet Take 81 mg by mouth daily. 03/22/14  Yes [provider]  Cholecalciferol (VITAMIN D PO) Take 2 tablets by mouth daily.   Yes [provider]  diphenhydramine-acetaminophen (TYLENOL PM) 25-500 MG TABS tablet Take 1 tablet by mouth at bedtime as needed (pain and sleep).   Yes [provider]  ferrous sulfate 325 (65 FE) MG tablet Take 1 tablet (325 mg total) by mouth daily. 04/08/14  Yes Daniel Nones, [place], PA-C  NAPROXEN DR 500 MG EC tablet Take 500 mg by mouth daily as needed (pain).  03/22/14  Yes [provider]  nitroGLYCERIN (NITROSTAT) 0.4 MG SL tablet Place 0.4 mg under the tongue every 5 (five) minutes as needed for chest pain.    Yes [provider]  omeprazole (PRILOSEC) 40 MG  capsule Take 40 mg by mouth daily as needed (heartburn).  07/16/16  Yes [provider]  HYDROcodone-acetaminophen (NORCO/VICODIN) 5-325 MG per tablet Take 1-2 tablets by mouth every 6 (six) hours as needed. Patient not taking: Reported on 07/25/2016 04/14/14   Glendell Docker, NP    Family History History reviewed. No pertinent family history.  Social History Social History  Substance Use Topics  . Smoking status: Former Research scientist (life sciences)  . Smokeless tobacco: Never Used  . Alcohol use No     Allergies   Patient has no known allergies.   Review of Systems Review of Systems  Respiratory: Positive for shortness of breath.   Cardiovascular: Positive for chest pain.  All other systems reviewed and are negative.    Physical Exam Updated Vital Signs BP (!) 172/103 (BP Location: Right Arm)   Pulse 71   Temp 99 F (37.2 C)   Resp 16   LMP 07/09/2016 (Approximate)   SpO2 100%   Physical Exam  Constitutional: She is oriented to person, place, and time.  Slightly uncomfortable   HENT:  Head: Normocephalic.  Mouth/Throat: Oropharynx is clear and moist.  Eyes: Conjunctivae and EOM are normal. Pupils are equal, round, and reactive to light.  Neck: Normal range of motion. Neck supple.  Cardiovascular: Normal rate, regular rhythm and normal heart sounds.  Pulmonary/Chest: Effort normal and breath sounds normal. No respiratory distress. She has no wheezes.  Some reproducible tenderness under L breast   Abdominal: Soft. Bowel sounds are normal. She exhibits no distension. There is no tenderness.  Musculoskeletal: Normal range of motion.  Neurological: She is alert and oriented to person, place, and time. No cranial nerve deficit. Coordination normal.  Skin: Skin is warm.  Psychiatric: She has a normal mood and affect.  Nursing note and vitals reviewed.    ED Treatments / Results  Labs (all labs ordered are listed, but only abnormal results are displayed) Labs Reviewed  BASIC  METABOLIC PANEL - Abnormal; Notable for the following:       Result Value   Glucose, Bld 106 (*)    All other components within normal limits  CBC - Abnormal; Notable for the following:    Hemoglobin 8.2 (*)    HCT 27.9 (*)    MCV 68.2 (*)    MCH 20.0 (*)    MCHC 29.4 (*)    RDW 20.9 (*)    All other components within normal limits  I-STAT TROPOININ, ED  I-STAT TROPOININ, ED    EKG  EKG Interpretation  Date/Time:  Saturday July 25 2016 15:09:22 EDT Ventricular Rate:  81 PR Interval:  146 QRS Duration: 74 QT Interval:  342 QTC Calculation: 397 R Axis:   49 Text Interpretation:  Normal sinus rhythm Possible Left atrial enlargement Borderline ECG No significant change since last tracing Confirmed by Wandra Arthurs [phone]) on 07/25/2016 6:28:59 PM       Radiology Dg Chest 2 View  Result Date: 07/25/2016 CLINICAL DATA:  Chest pain EXAM: CHEST  2 VIEW COMPARISON:  05/26/2011 chest radiograph. FINDINGS: Stable cardiomediastinal silhouette with normal heart size. No pneumothorax. No pleural effusion. No pulmonary edema. No acute consolidative airspace disease. Tiny 3 mm nodular density overlies the right upper lung between the posterior right fifth and sixth ribs, not definitely seen on the prior chest radiograph. IMPRESSION: 1. Tiny 3 mm nodular density in the upper right lung was not definitely seen on the prior chest radiograph, cannot exclude a small pulmonary nodule. If the patient has risk factors for lung malignancy, short-term outpatient chest CT correlation advised . 2. Otherwise no active cardiopulmonary disease. Electronically Signed   By: Ilona Sorrel M.D.   On: 07/25/2016 15:55   Ct Angio Chest Pe W Or Wo Contrast  Result Date: 07/25/2016 CLINICAL DATA:  Chest pain and shortness of breath EXAM: CT ANGIOGRAPHY CHEST WITH CONTRAST TECHNIQUE: Multidetector CT imaging of the chest was performed using the standard protocol during bolus administration of intravenous contrast.  Multiplanar CT image reconstructions and MIPs were obtained to evaluate the vascular anatomy. CONTRAST:  100 cc Isovue 370 intravenous COMPARISON:  07/25/2016, 05/26/2011 FINDINGS: Cardiovascular: Satisfactory opacification of the pulmonary arteries to the segmental level. No evidence of pulmonary embolism. Normal heart size. No pericardial effusion. Non aneurysmal aorta. No dissection. Mediastinum/Nodes: No enlarged mediastinal, hilar, or axillary lymph nodes. Thyroid gland, trachea, and esophagus demonstrate no significant findings. Lungs/Pleura: Lungs are clear. No pleural effusion or pneumothorax. No definite lung nodule seen to correspond to radiographic finding. Upper Abdomen: No acute abnormality. Musculoskeletal: No chest wall abnormality. No acute or significant osseous findings. Review of the MIP images confirms the above findings. IMPRESSION: Negative for acute pulmonary embolus or aortic dissection. Clear lung fields. Electronically Signed   By: Donavan Foil M.D.   On: 07/25/2016 20:47    Procedures Procedures (including  critical care time)  Medications Ordered in ED Medications  cyclobenzaprine (FLEXERIL) tablet 5 mg (not administered)  sodium chloride 0.9 % bolus 1,000 mL (1,000 mLs Intravenous New Bag/Given 07/25/16 1829)  morphine 4 MG/ML injection 4 mg (4 mg Intravenous Given 07/25/16 1829)  iopamidol (ISOVUE-370) 76 % injection (100 mLs Intravenous Contrast Given 07/25/16 2010)     Initial Impression / Assessment and Plan / ED Course  I have reviewed the triage vital signs and the nursing notes.  Pertinent labs & imaging results that were available during my care of the patient were reviewed by me and considered in my medical decision making (see chart for details).    Carla Powell is a 46 y.o. female here with chest pain that is pleuritic and positional. Likely muscle strain. However, given pleuritic component and patient had positive D dimer before, will get CT angio chest.  Will get trop x 2. I doubt dissection.   9:09 PM Trop neg x 2. Initial CXR showed possible pulmonary nodule but CT showed no mass or nodule or PE. Hg 8.2, baseline around 9. She states that she had heavy menses about a month ago but has no active bleeding and denies vomiting blood or melena. I doubt symptomatic anemia. Will have her follow up outpatient.   Final Clinical Impressions(s) / ED Diagnoses   Final diagnoses:  None    New Prescriptions New Prescriptions   No medications on file     Drenda Freeze, MD 07/25/16 2112

## 2017-09-29 ENCOUNTER — Emergency Department (HOSPITAL_COMMUNITY)
Admission: EM | Admit: 2017-09-29 | Discharge: 2017-09-30 | Disposition: A | Discharge status: Home / Self Care | Payer: Self-pay | Attending: Emergency Medicine | Admitting: Emergency Medicine

## 2017-09-29 ENCOUNTER — Other Ambulatory Visit: Payer: Self-pay

## 2017-09-29 ENCOUNTER — Encounter (HOSPITAL_COMMUNITY): Payer: Self-pay | Admitting: *Deleted

## 2017-09-29 DIAGNOSIS — Z79899 Other long term (current) drug therapy: Secondary | ICD-10-CM | POA: Insufficient documentation

## 2017-09-29 DIAGNOSIS — R51 Headache: Secondary | ICD-10-CM | POA: Insufficient documentation

## 2017-09-29 DIAGNOSIS — G44209 Tension-type headache, unspecified, not intractable: Secondary | ICD-10-CM

## 2017-09-29 DIAGNOSIS — R030 Elevated blood-pressure reading, without diagnosis of hypertension: Secondary | ICD-10-CM

## 2017-09-29 DIAGNOSIS — I10 Essential (primary) hypertension: Secondary | ICD-10-CM | POA: Insufficient documentation

## 2017-09-29 DIAGNOSIS — D509 Iron deficiency anemia, unspecified: Secondary | ICD-10-CM

## 2017-09-29 DIAGNOSIS — Z7982 Long term (current) use of aspirin: Secondary | ICD-10-CM | POA: Insufficient documentation

## 2017-09-29 DIAGNOSIS — E876 Hypokalemia: Secondary | ICD-10-CM

## 2017-09-29 NOTE — ED Triage Notes (Signed)
The pt is c/o a headache and high bp all day  She was on bp med 7 years ago but the doctor her off the bp med.  She usually does not have headaches  lmp aug 16th

## 2017-09-30 LAB — BASIC METABOLIC PANEL
Anion gap: 9 (ref 5–15)
BUN: 9 mg/dL (ref 6–20)
CALCIUM: 9 mg/dL (ref 8.9–10.3)
CO2: 28 mmol/L (ref 22–32)
CREATININE: 0.75 mg/dL (ref 0.44–1.00)
Chloride: 102 mmol/L (ref 98–111)
GFR calc Af Amer: >60 mL/min (ref >60)
Glucose, Bld: 85 mg/dL (ref 70–99)
Potassium: 2.8 mmol/L — ABNORMAL LOW (ref 3.5–5.1)
Sodium: 139 mmol/L (ref 135–145)

## 2017-09-30 LAB — CBC
HCT: 28.2 % — ABNORMAL LOW (ref 36.0–46.0)
Hemoglobin: 8.2 g/dL — ABNORMAL LOW (ref 12.0–15.0)
MCH: 21.2 pg — ABNORMAL LOW (ref 26.0–34.0)
MCHC: 29.1 g/dL — AB (ref 30.0–36.0)
MCV: 73.1 fL — ABNORMAL LOW (ref 78.0–100.0)
Platelets: 309 10*3/uL (ref 150–400)
RBC: 3.86 MIL/uL — AB (ref 3.87–5.11)
RDW: 21.9 % — ABNORMAL HIGH (ref 11.5–15.5)
WBC: 6 10*3/uL (ref 4.0–10.5)

## 2017-09-30 LAB — DIFFERENTIAL
BASOS PCT: 1 %
Basophils Absolute: 0.1 10*3/uL (ref 0.0–0.1)
Eosinophils Absolute: 0.2 10*3/uL (ref 0.0–0.7)
Eosinophils Relative: 4 %
Lymphocytes Relative: 36 %
Lymphs Abs: 2.2 10*3/uL (ref 0.7–4.0)
MONO ABS: 0.4 10*3/uL (ref 0.1–1.0)
Monocytes Relative: 6 %
Neutro Abs: 3.1 10*3/uL (ref 1.7–7.7)
Neutrophils Relative %: 53 %

## 2017-09-30 LAB — I-STAT TROPONIN, ED: TROPONIN I, POC: 0 ng/mL (ref 0.00–0.08)

## 2017-09-30 MED ORDER — POTASSIUM CHLORIDE CRYS ER 20 MEQ PO TBCR: 20.0000 meq | EXTENDED_RELEASE_TABLET | Freq: Two times a day (BID) | ORAL | 0 refills | Status: AC

## 2017-09-30 MED ORDER — POTASSIUM CHLORIDE 10 MEQ/100ML IV SOLN
10.0000 meq | Freq: Once | INTRAVENOUS | Status: AC
  Administered 2017-09-30: 10 meq via INTRAVENOUS
  Filled 2017-09-30: qty 100

## 2017-09-30 MED ORDER — DIPHENHYDRAMINE HCL 50 MG/ML IJ SOLN
25.0000 mg | Freq: Once | INTRAMUSCULAR | Status: AC
  Administered 2017-09-30: 25 mg via INTRAVENOUS
  Filled 2017-09-30: qty 1

## 2017-09-30 MED ORDER — POTASSIUM CHLORIDE CRYS ER 20 MEQ PO TBCR
40.0000 meq | EXTENDED_RELEASE_TABLET | Freq: Once | ORAL | Status: AC
  Administered 2017-09-30: 40 meq via ORAL
  Filled 2017-09-30: qty 2

## 2017-09-30 MED ORDER — SODIUM CHLORIDE 0.9 % IV BOLUS
1000.0000 mL | Freq: Once | INTRAVENOUS | Status: AC
  Administered 2017-09-30: 1000 mL via INTRAVENOUS

## 2017-09-30 MED ORDER — METOCLOPRAMIDE HCL 10 MG PO TABS: 10.0000 mg | ORAL_TABLET | Freq: Four times a day (QID) | ORAL | 0 refills | Status: AC | PRN

## 2017-09-30 MED ORDER — POTASSIUM CHLORIDE CRYS ER 20 MEQ PO TBCR: 20.0000 meq | EXTENDED_RELEASE_TABLET | Freq: Two times a day (BID) | ORAL | 0 refills | Status: DC

## 2017-09-30 MED ORDER — METOCLOPRAMIDE HCL 5 MG/ML IJ SOLN
10.0000 mg | Freq: Once | INTRAMUSCULAR | Status: AC
  Administered 2017-09-30: 10 mg via INTRAVENOUS
  Filled 2017-09-30: qty 2

## 2017-09-30 NOTE — ED Notes (Signed)
Pt complains of a headache that started about 2130 last evening. Pt states she has a history of HTN but she is no longer on it. Pt reports she does take nitro for angina but had not taken any in a while.

## 2017-09-30 NOTE — ED Notes (Signed)
Patient ambulatory to bathroom with steady gait at this time 

## 2017-09-30 NOTE — ED Provider Notes (Signed)
[facility] EMERGENCY DEPARTMENT Provider Note   CSN: [id number] Arrival date & time: 09/29/17  2327     History   Chief Complaint Chief Complaint  Patient presents with  . Hypertension  . Headache    HPI Carla Powell is a 47 y.o. female.  The history is provided by the patient.  She has a history of hypertension and comes in because of headache and elevated blood pressure at home.  She got off work and noted a headache across the back of her head into the left temporal area.  She describes it as a swimming feeling and she rated it at 10/10.  It was worse with exposure to light and noise.  She also states that she is seeing spots.  She denies nausea or vomiting.  She denies weakness, numbness, tingling.  She does not usually get headaches like this.  She checked her blood pressure at home and noted it was 187/110.  (Blood pressure cuff belongs to her son who has renal artery stenosis).  She is not currently being treated for hypertension.  Past Medical History:  Diagnosis Date  . Angina pectoris   . Hypertension     There are no active problems to display for this patient.   Past Surgical History:  Procedure Laterality Date  . CESAREAN SECTION       OB History   None      Home Medications    Prior to Admission medications   Medication Sig Start Date End Date Taking? Authorizing Provider  aspirin 81 MG EC tablet Take 81 mg by mouth daily. 03/22/14  Yes [provider]  ferrous sulfate 325 (65 FE) MG tablet Take 1 tablet (325 mg total) by mouth daily. 04/08/14  Yes Daniel Nones, [place], PA-C  nitroGLYCERIN (NITROSTAT) 0.4 MG SL tablet Place 0.4 mg under the tongue every 5 (five) minutes as needed for chest pain.    Yes [provider]  cyclobenzaprine (FLEXERIL) 5 MG tablet Take 1 tablet (5 mg total) by mouth 3 (three) times daily as needed for muscle spasms. Patient not taking: Reported on 09/30/2017 07/25/16   Drenda Freeze, MD    HYDROcodone-acetaminophen (NORCO/VICODIN) 5-325 MG per tablet Take 1-2 tablets by mouth every 6 (six) hours as needed. Patient not taking: Reported on 07/25/2016 04/14/14   Glendell Docker, NP    Family History No family history on file.  Social History Social History   Tobacco Use  . Smoking status: Former Research scientist (life sciences)  . Smokeless tobacco: Never Used  Substance Use Topics  . Alcohol use: No  . Drug use: No     Allergies   Patient has no known allergies.   Review of Systems Review of Systems  All other systems reviewed and are negative.    Physical Exam Updated Vital Signs BP (!) 182/114   Pulse 80   Temp 99.2 F (37.3 C) (Oral)   Resp 16   Ht 5' 4"  (1.626 m)   Wt 75.3 kg   LMP 09/24/2017   SpO2 100%   BMI 28.49 kg/m   Physical Exam  Nursing note and vitals reviewed.  47 year old female, resting comfortably and in no acute distress. Vital signs are significant for elevated blood pressure. Oxygen saturation is 100%, which is normal. Head is normocephalic and atraumatic. PERRLA, EOMI. Oropharynx is clear.  Fundi show no hemorrhage, exudate, or papilledema.  There is tenderness palpation at the insertion of the left temporalis muscle and at the insertion  of the paracervical muscles. Neck is nontender and supple without adenopathy or JVD. Back is nontender and there is no CVA tenderness. Lungs are clear without rales, wheezes, or rhonchi. Chest is nontender. Heart has regular rate and rhythm without murmur. Abdomen is soft, flat, nontender without masses or hepatosplenomegaly and peristalsis is normoactive. Extremities have no cyanosis or edema, full range of motion is present. Skin is warm and dry without rash. Neurologic: Mental status is normal, cranial nerves are intact, there are no motor or sensory deficits.  ED Treatments / Results  Labs (all labs ordered are listed, but only abnormal results are displayed) Labs Reviewed  BASIC METABOLIC PANEL - Abnormal;  Notable for the following components:      Result Value   Potassium 2.8 (*)    All other components within normal limits  CBC - Abnormal; Notable for the following components:   RBC 3.86 (*)    Hemoglobin 8.2 (*)    HCT 28.2 (*)    MCV 73.1 (*)    MCH 21.2 (*)    MCHC 29.1 (*)    RDW 21.9 (*)    All other components within normal limits  DIFFERENTIAL  I-STAT TROPONIN, ED    EKG EKG Interpretation  Date/Time:  Thursday September 30 2017 05:54:18 EDT Ventricular Rate:  68 PR Interval:    QRS Duration: 89 QT Interval:  398 QTC Calculation: 424 R Axis:   20 Text Interpretation:  Sinus rhythm LAE, consider biatrial enlargement Otherwise within normal limits When compared with ECG of 07/25/2016, No significant change was found Confirmed by Delora Fuel ([id number]) on 09/30/2017 6:07:44 AM   Procedures Procedures  Medications Ordered in ED Medications  sodium chloride 0.9 % bolus 1,000 mL (has no administration in time range)  metoCLOPramide (REGLAN) injection 10 mg (has no administration in time range)  diphenhydrAMINE (BENADRYL) injection 25 mg (has no administration in time range)     Initial Impression / Assessment and Plan / ED Course  I have reviewed the triage vital signs and the nursing notes.  Pertinent lab results that were available during my care of the patient were reviewed by me and considered in my medical decision making (see chart for details).  Headache which clinically appears to be muscle contraction headache.  No red flags to suggest more serious causes of headache.  Elevated blood pressure.  I do not believe this is the cause of her headache.  On review of old records, she has had significantly elevated blood pressure for last 3 ED visits, several have been in the same range as tonight.  She will be given a headache cocktail of normal saline, metoclopramide, diphenhydramine and will assess clinical response as well as blood pressure response.  7:25 AM She had  excellent relief of her headache with above-noted treatment.  Blood pressure has come back down to normal.  However, labs do show significant hypokalemia.  Because of this is unclear, but she is given IV and oral potassium.  Moderately severe anemia is also present which is unchanged from baseline.  Following potassium, she will be discharged with prescription for metoclopramide and K-Dur.  Advised to have her blood pressure checked once a day and follow-up with PCP in 78month  Return precautions discussed.  Final Clinical Impressions(s) / ED Diagnoses   Final diagnoses:  Muscle contraction headache  Elevated blood-pressure reading without diagnosis of hypertension  Hypokalemia  Microcytic anemia    ED Discharge Orders  Ordered    metoCLOPramide (REGLAN) 10 MG tablet  Every 6 hours PRN     09/30/17 0724    potassium chloride SA (K-DUR,KLOR-CON) 20 MEQ tablet  2 times daily     09/30/17 2244           Delora Fuel, MD 97/53/00 [phone]

## 2017-09-30 NOTE — Discharge Instructions (Signed)
Please check your blood pressure once a day and keep a record of it. Take that record with you when you see your doctor.

## 2017-09-30 NOTE — ED Notes (Signed)
Patient verbalizes understanding of discharge instructions. Opportunity for questioning and answers were provided. Armband removed by staff, pt discharged from ED ambulatory.   

## 2018-01-14 ENCOUNTER — Encounter (HOSPITAL_COMMUNITY): Payer: Self-pay | Admitting: Emergency Medicine

## 2018-01-14 ENCOUNTER — Other Ambulatory Visit: Payer: Self-pay

## 2018-01-14 ENCOUNTER — Emergency Department (HOSPITAL_COMMUNITY)
Admission: EM | Admit: 2018-01-14 | Discharge: 2018-01-14 | Disposition: A | Discharge status: Home / Self Care | Payer: Medicaid Other | Attending: Emergency Medicine | Admitting: Emergency Medicine

## 2018-01-14 ENCOUNTER — Emergency Department (HOSPITAL_COMMUNITY): Payer: Medicaid Other

## 2018-01-14 DIAGNOSIS — Z79899 Other long term (current) drug therapy: Secondary | ICD-10-CM | POA: Insufficient documentation

## 2018-01-14 DIAGNOSIS — E876 Hypokalemia: Secondary | ICD-10-CM | POA: Insufficient documentation

## 2018-01-14 DIAGNOSIS — R0789 Other chest pain: Secondary | ICD-10-CM | POA: Insufficient documentation

## 2018-01-14 DIAGNOSIS — Z7982 Long term (current) use of aspirin: Secondary | ICD-10-CM | POA: Insufficient documentation

## 2018-01-14 DIAGNOSIS — I10 Essential (primary) hypertension: Secondary | ICD-10-CM | POA: Insufficient documentation

## 2018-01-14 DIAGNOSIS — Z87891 Personal history of nicotine dependence: Secondary | ICD-10-CM | POA: Insufficient documentation

## 2018-01-14 LAB — I-STAT TROPONIN, ED: TROPONIN I, POC: 0 ng/mL (ref 0.00–0.08)

## 2018-01-14 LAB — I-STAT BETA HCG BLOOD, ED (MC, WL, AP ONLY)

## 2018-01-14 LAB — CBC
HCT: 27.1 % — ABNORMAL LOW (ref 36.0–46.0)
HEMOGLOBIN: 7.8 g/dL — AB (ref 12.0–15.0)
MCH: 21.5 pg — ABNORMAL LOW (ref 26.0–34.0)
MCHC: 28.8 g/dL — ABNORMAL LOW (ref 30.0–36.0)
MCV: 74.7 fL — ABNORMAL LOW (ref 80.0–100.0)
NRBC: 0 % (ref 0.0–0.2)
Platelets: 392 10*3/uL (ref 150–400)
RBC: 3.63 MIL/uL — AB (ref 3.87–5.11)
RDW: 19.6 % — ABNORMAL HIGH (ref 11.5–15.5)
WBC: 5 10*3/uL (ref 4.0–10.5)

## 2018-01-14 LAB — BASIC METABOLIC PANEL
ANION GAP: 10 (ref 5–15)
BUN: <5 mg/dL — ABNORMAL LOW (ref 6–20)
CALCIUM: 8.9 mg/dL (ref 8.9–10.3)
CO2: 25 mmol/L (ref 22–32)
Chloride: 105 mmol/L (ref 98–111)
Creatinine, Ser: 0.93 mg/dL (ref 0.44–1.00)
Glucose, Bld: 112 mg/dL — ABNORMAL HIGH (ref 70–99)
POTASSIUM: 3 mmol/L — AB (ref 3.5–5.1)
Sodium: 140 mmol/L (ref 135–145)

## 2018-01-14 MED ORDER — IBUPROFEN 600 MG PO TABS: 600.0000 mg | ORAL_TABLET | Freq: Four times a day (QID) | ORAL | 0 refills | Status: AC | PRN

## 2018-01-14 MED ORDER — METHOCARBAMOL 500 MG PO TABS: 500.0000 mg | ORAL_TABLET | Freq: Two times a day (BID) | ORAL | 0 refills | Status: AC | PRN

## 2018-01-14 MED ORDER — KETOROLAC TROMETHAMINE 60 MG/2ML IM SOLN
60.0000 mg | Freq: Once | INTRAMUSCULAR | Status: AC
  Administered 2018-01-14: 60 mg via INTRAMUSCULAR
  Filled 2018-01-14: qty 2

## 2018-01-14 MED ORDER — POTASSIUM CHLORIDE ER 10 MEQ PO TBCR: 10.0000 meq | EXTENDED_RELEASE_TABLET | Freq: Every day | ORAL | 0 refills | Status: AC

## 2018-01-14 NOTE — ED Notes (Signed)
Patient verbalizes understanding of discharge instructions. Opportunity for questioning and answers were provided. Armband removed by staff, pt discharged from ED.  

## 2018-01-14 NOTE — ED Provider Notes (Signed)
[facility] EMERGENCY DEPARTMENT Provider Note   CSN: [id number] Arrival date & time: 01/14/18  1431     History   Chief Complaint Chief Complaint  Patient presents with  . Chest Pain  . Shortness of Breath    HPI Carla Powell is a 47 y.o. female.  HPI  The patient is a 47 year old female, she reports that she has a history of angina pectoris as well as hypertension, she has been treated by Dr. Einar Gip in the past and states that she had a stress test which she was told was unremarkable.  This was last year.  She has had intermittent history of chest pain and has been seen in the emergency department for this in the past.  In fact she has had fairly broad work-ups for this including CT angiograms, CT coronaries none of which have shown any other pathology.  Approximately 2 days ago the patient had recurrent pain in the right side of her chest, usually it is on the left, this time it is on the right with some radiation into the shoulder and the neck, it is terrible pain that is almost constant, worse when she tries to move her head, move her neck or use her arm.  She has taken muscle relaxers and anti-inflammatories without much relief.  She reports that the pain is slightly worse with deep breathing but she does not feel short of breath, has no fevers, no swelling of the legs, no coughing.  The patient does report a history of anemia, takes intermittent iron, reports that she does not know why she is chronically anemic, review of the medical record shows that it is fairly stable over time around 8  Past Medical History:  Diagnosis Date  . Angina pectoris   . Hypertension     There are no active problems to display for this patient.   Past Surgical History:  Procedure Laterality Date  . CESAREAN SECTION       OB History   None      Home Medications    Prior to Admission medications   Medication Sig Start Date End Date Taking? Authorizing Provider    aspirin 81 MG EC tablet Take 81 mg by mouth daily. 03/22/14   [provider]  ferrous sulfate 325 (65 FE) MG tablet Take 1 tablet (325 mg total) by mouth daily. 04/08/14   Al Corpus, PA-C  ibuprofen (ADVIL,MOTRIN) 600 MG tablet Take 1 tablet (600 mg total) by mouth every 6 (six) hours as needed. 01/14/18   Noemi Chapel, MD  methocarbamol (ROBAXIN) 500 MG tablet Take 1 tablet (500 mg total) by mouth 2 (two) times daily as needed for muscle spasms. 01/14/18   Noemi Chapel, MD  metoCLOPramide (REGLAN) 10 MG tablet Take 1 tablet (10 mg total) by mouth every 6 (six) hours as needed for nausea (or headache). 5/78/46   Delora Fuel, MD  nitroGLYCERIN (NITROSTAT) 0.4 MG SL tablet Place 0.4 mg under the tongue every 5 (five) minutes as needed for chest pain.     [provider]  potassium chloride (K-DUR) 10 MEQ tablet Take 1 tablet (10 mEq total) by mouth daily. 01/14/18   Noemi Chapel, MD  potassium chloride SA (K-DUR,KLOR-CON) 20 MEQ tablet Take 1 tablet (20 mEq total) by mouth 2 (two) times daily. 9/62/95   Delora Fuel, MD    Family History No family history on file.  Social History Social History   Tobacco Use  . Smoking status: Former  Smoker  . Smokeless tobacco: Never Used  Substance Use Topics  . Alcohol use: No  . Drug use: No     Allergies   Patient has no known allergies.   Review of Systems Review of Systems  All other systems reviewed and are negative.    Physical Exam Updated Vital Signs BP (!) 165/97   Pulse 75   Temp 98.2 F (36.8 C) (Oral)   Resp 16   Ht 1.626 m (5' 4" )   Wt 72.6 kg   SpO2 100%   BMI 27.46 kg/m   Physical Exam  Constitutional: She appears well-developed and well-nourished. No distress.  HENT:  Head: Normocephalic and atraumatic.  Mouth/Throat: Oropharynx is clear and moist. No oropharyngeal exudate.  Eyes: Pupils are equal, round, and reactive to light. Conjunctivae and EOM are normal. Right eye exhibits no  discharge. Left eye exhibits no discharge. No scleral icterus.  Neck: Normal range of motion. Neck supple. No JVD present. No thyromegaly present.  Cardiovascular: Normal rate, regular rhythm, normal heart sounds and intact distal pulses. Exam reveals no gallop and no friction rub.  No murmur heard. Pulmonary/Chest: Effort normal and breath sounds normal. No respiratory distress. She has no wheezes. She has no rales.  With a chaperone present the patient was examined, she has tenderness over her right upper chest wall and right breast tissue.  This is worse with movement of the arm the shoulder taking a deep breath and gentle palpation.  There is no crepitance or subcutaneous emphysema, no rash on the chest wall, the tenderness exists also on the trapezius and the deltoid of the right arm.  There is no tenderness on the left.  Abdominal: Soft. Bowel sounds are normal. She exhibits no distension and no mass. There is no tenderness.  Musculoskeletal: Normal range of motion. She exhibits no edema or tenderness.  Lymphadenopathy:    She has no cervical adenopathy.  Neurological: She is alert. Coordination normal.  Skin: Skin is warm and dry. No rash noted. No erythema.  Psychiatric: She has a normal mood and affect. Her behavior is normal.  Nursing note and vitals reviewed.    ED Treatments / Results  Labs (all labs ordered are listed, but only abnormal results are displayed) Labs Reviewed  BASIC METABOLIC PANEL - Abnormal; Notable for the following components:      Result Value   Potassium 3.0 (*)    Glucose, Bld 112 (*)    BUN <5 (*)    All other components within normal limits  CBC - Abnormal; Notable for the following components:   RBC 3.63 (*)    Hemoglobin 7.8 (*)    HCT 27.1 (*)    MCV 74.7 (*)    MCH 21.5 (*)    MCHC 28.8 (*)    RDW 19.6 (*)    All other components within normal limits  I-STAT TROPONIN, ED  I-STAT BETA HCG BLOOD, ED ([facility], [facility], [facility] ONLY)    EKG EKG  Interpretation  Date/Time:  Friday January 14 2018 14:36:33 EST Ventricular Rate:  83 PR Interval:  158 QRS Duration: 80 QT Interval:  364 QTC Calculation: 427 R Axis:   30 Text Interpretation:  Normal sinus rhythm Possible Left atrial enlargement Borderline ECG since last tracing no significant change Confirmed by Noemi Chapel [id number]) on 01/14/2018 3:46:20 PM   Radiology Dg Chest 2 View  Result Date: 01/14/2018 CLINICAL DATA:  Right-sided chest pain and shortness of breath for 1 day. EXAM: CHEST -  2 VIEW COMPARISON:  Chest CT 07/25/2016 FINDINGS: Cardiomediastinal silhouette is normal. Mediastinal contours appear intact. There is no evidence of focal airspace consolidation, pleural effusion or pneumothorax. Osseous structures are without acute abnormality. Soft tissues are grossly normal. IMPRESSION: No active cardiopulmonary disease. Electronically Signed   By: Fidela Salisbury M.D.   On: 01/14/2018 15:15    Procedures Procedures (including critical care time)  Medications Ordered in ED Medications  ketorolac (TORADOL) injection 60 mg (has no administration in time range)     Initial Impression / Assessment and Plan / ED Course  I have reviewed the triage vital signs and the nursing notes.  Pertinent labs & imaging results that were available during my care of the patient were reviewed by me and considered in my medical decision making (see chart for details).  Clinical Course as of Jan 15 1636  Fri Jan 14, 2018  1634 Hypokalemia - 3.0, trop neg, istat neg, Xray neg, pt informed   [BM]    Clinical Course User Index [BM] Noemi Chapel, MD    CBC shows the chronic anemia, chest x-ray is unremarkable, the EKG is also unremarkable.  At this time the patient will pend the rest of her labs including troponin and a basic metabolic panel though it seems extremely unlikely for this to be cardiac in origin.  She is agreeable to symptomatic treatment including anti-inflammatories  and rest.  Patient agreeable to disposition, Toradol given, prescriptions as below  Final Clinical Impressions(s) / ED Diagnoses   Final diagnoses:  Chest wall pain  Hypokalemia    ED Discharge Orders         Ordered    potassium chloride (K-DUR) 10 MEQ tablet  Daily     01/14/18 1635    ibuprofen (ADVIL,MOTRIN) 600 MG tablet  Every 6 hours PRN     01/14/18 1636    methocarbamol (ROBAXIN) 500 MG tablet  2 times daily PRN     01/14/18 1636           Noemi Chapel, MD 01/14/18 1637

## 2018-01-14 NOTE — Discharge Instructions (Signed)
Take ibuprofen 3 times daily for pain Robaxin twice daily for muscle spasm Stretch the muscles Ice and heat See your doctor in 1 week Potassium daily for 10 days -   ER for worsening symptoms.

## 2018-01-14 NOTE — ED Triage Notes (Signed)
Onset one day ago developed chest pain and shortness of breath when waking up. Symptoms continued today. Pain worsening with movement. Took muscle relaxer with no relief.

## 2018-08-27 IMAGING — DX DG CHEST 2V
2 series · 2 of 2 positions shown · non-contrast
Comparison: 05/26/2011 chest radiograph.

CLINICAL DATA: Chest pain

EXAM:
CHEST  2 VIEW

[chest pa]
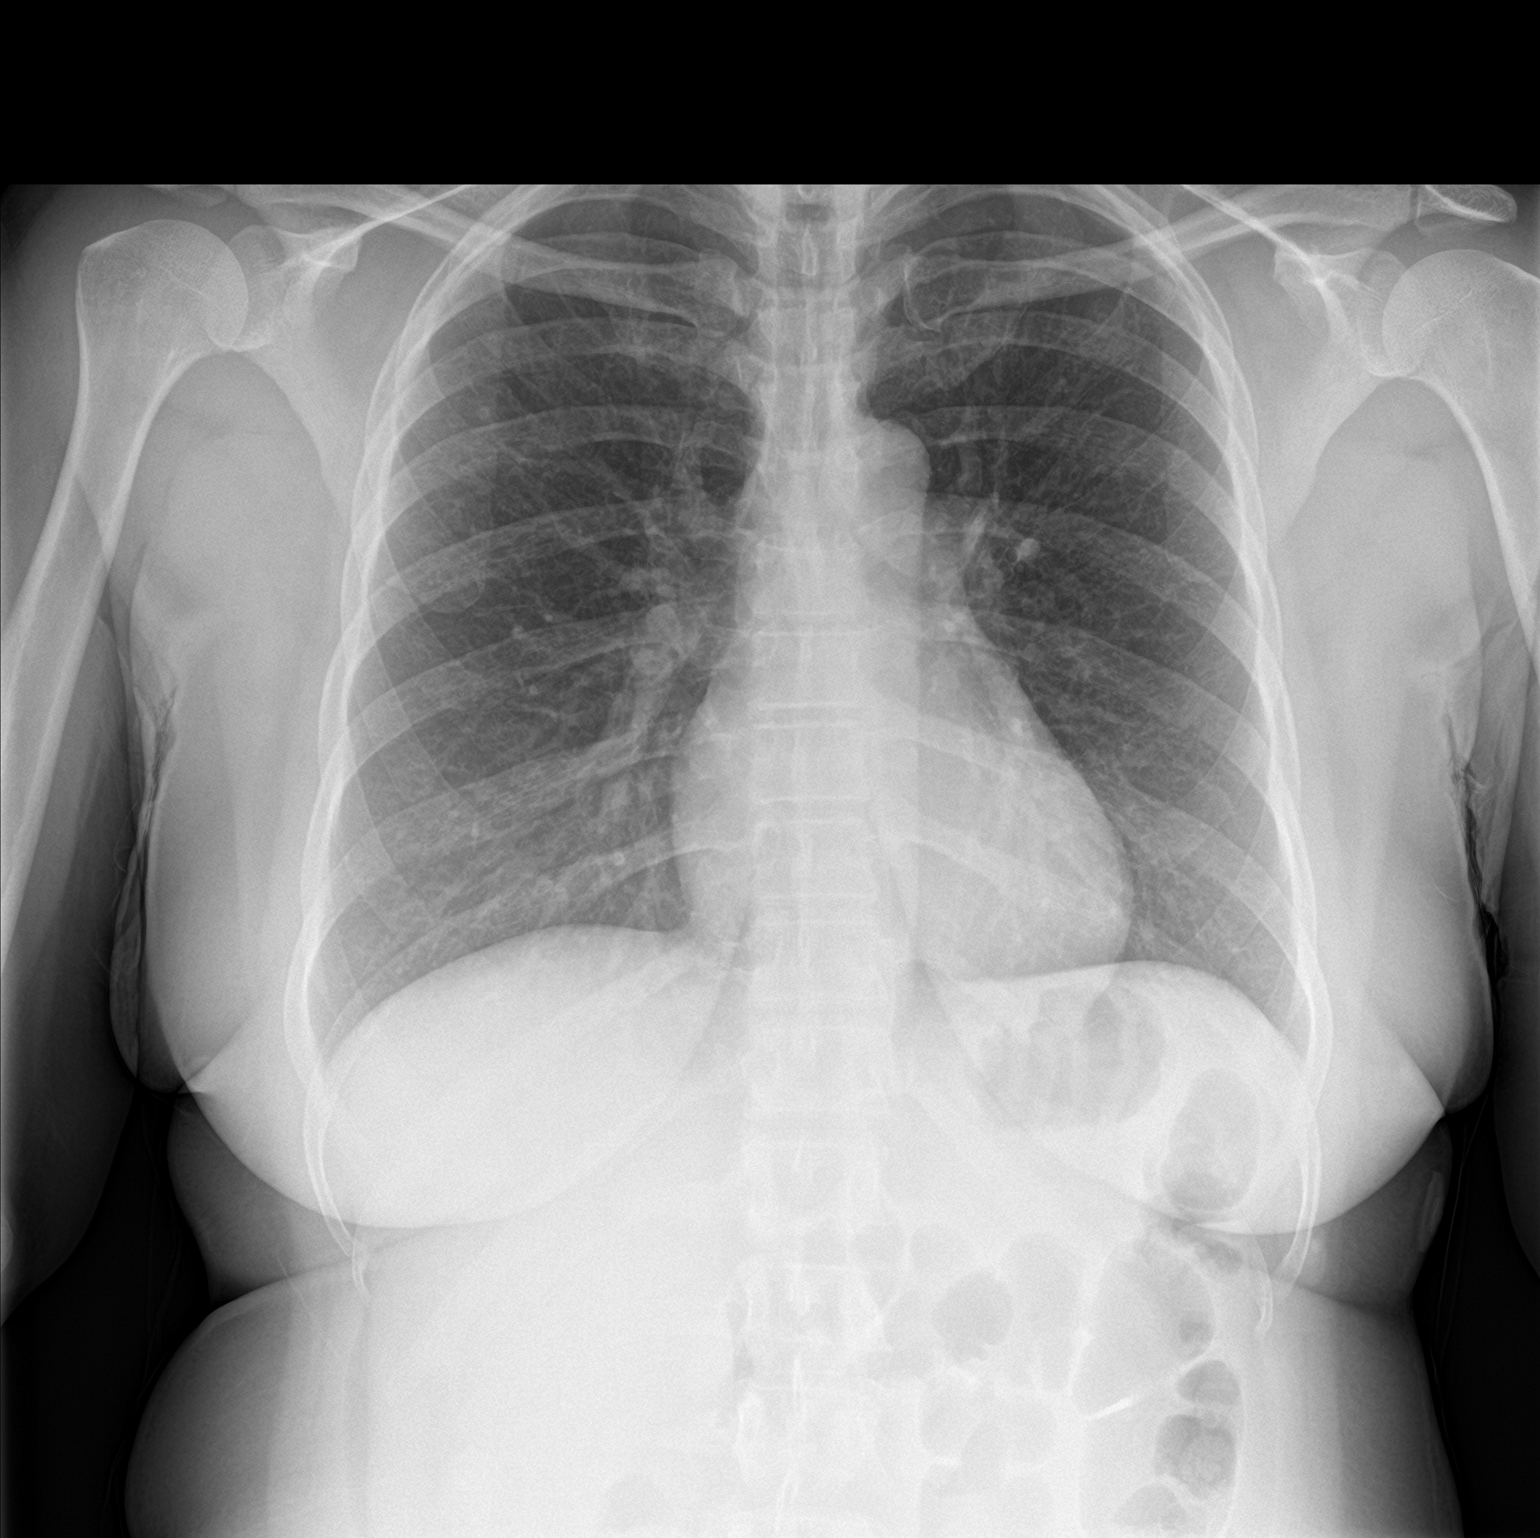

[chest lat]
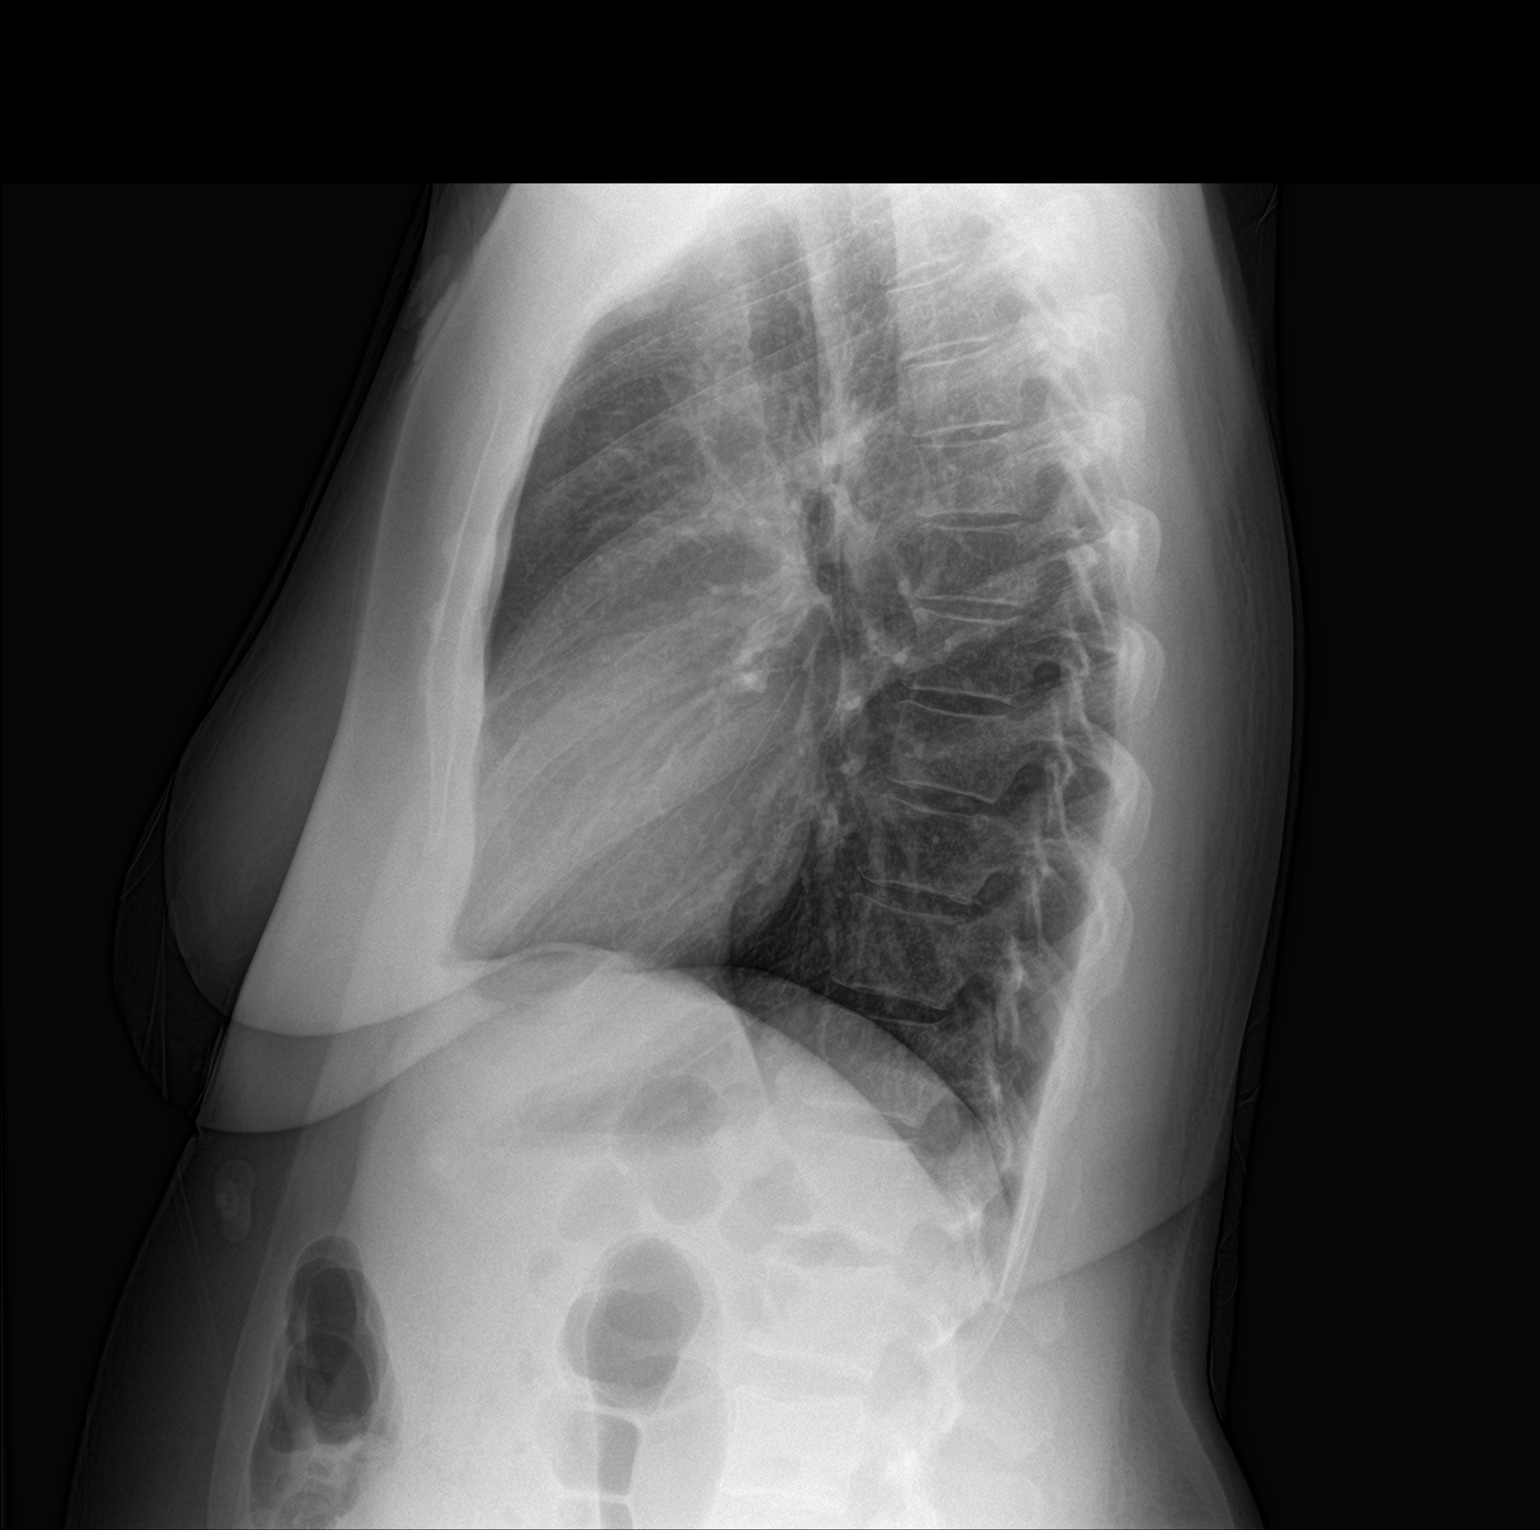

[2 of 2 positions shown; findings below may reference images not displayed]

FINDINGS: Stable cardiomediastinal silhouette with normal heart size. No
pneumothorax. No pleural effusion. No pulmonary edema. No acute
consolidative airspace disease. Tiny 3 mm nodular density overlies
the right upper lung between the posterior right fifth and sixth
ribs, not definitely seen on the prior chest radiograph.
IMPRESSION: 1. Tiny 3 mm nodular density in the upper right lung was not
definitely seen on the prior chest radiograph, cannot exclude a
small pulmonary nodule. If the patient has risk factors for lung
malignancy, short-term outpatient chest CT correlation advised .
2. Otherwise no active cardiopulmonary disease.

## 2019-05-23 ENCOUNTER — Encounter (HOSPITAL_COMMUNITY): Payer: Self-pay | Admitting: Emergency Medicine

## 2019-05-23 ENCOUNTER — Other Ambulatory Visit: Payer: Self-pay

## 2019-05-23 ENCOUNTER — Emergency Department (HOSPITAL_COMMUNITY)
Admission: EM | Admit: 2019-05-23 | Discharge: 2019-05-23 | Disposition: A | Discharge status: Home / Self Care | Payer: Medicaid Other | Attending: Emergency Medicine | Admitting: Emergency Medicine

## 2019-05-23 DIAGNOSIS — Z87891 Personal history of nicotine dependence: Secondary | ICD-10-CM | POA: Insufficient documentation

## 2019-05-23 DIAGNOSIS — L0231 Cutaneous abscess of buttock: Secondary | ICD-10-CM | POA: Insufficient documentation

## 2019-05-23 DIAGNOSIS — I10 Essential (primary) hypertension: Secondary | ICD-10-CM | POA: Insufficient documentation

## 2019-05-23 DIAGNOSIS — K0889 Other specified disorders of teeth and supporting structures: Secondary | ICD-10-CM | POA: Insufficient documentation

## 2019-05-23 MED ORDER — LIDOCAINE HCL (PF) 1 % IJ SOLN
10.0000 mL | Freq: Once | INTRAMUSCULAR | Status: AC
  Administered 2019-05-23: 10 mL
  Filled 2019-05-23: qty 10

## 2019-05-23 MED ORDER — CLINDAMYCIN HCL 150 MG PO CAPS: 450.0000 mg | ORAL_CAPSULE | Freq: Three times a day (TID) | ORAL | 0 refills | Status: AC

## 2019-05-23 NOTE — ED Triage Notes (Signed)
Pt c/o "boils" to L maxillary gums and swelling around tooth onset yesterday.

## 2019-05-23 NOTE — Discharge Instructions (Addendum)
As discussed, I am sending home with an antibiotic.  Take as prescribed.  Continue to use warm compresses over the area.  Please follow-up with PCP by the end of the week for a wound recheck.  It is normal to see drainage from the area.  Return to the ER for new or worsening symptoms.  You may take over-the-counter ibuprofen or Tylenol as needed for pain.

## 2019-05-23 NOTE — ED Provider Notes (Signed)
[facility] EMERGENCY DEPARTMENT Provider Note   CSN: [id number] Arrival date & time: 05/23/19  1918     History Chief Complaint  Patient presents with  . Oral Swelling  . Groin Swelling    Carla Powell is a 49 y.o. female with a past medical history significant for hypertension who presents to the ED due to dental pain and "boils" in the genital region and buttocks.  Patient notes sores have been there for the past day or 2 and have progressively worsened.  Patient notes pain is worse with palpation.  Denies fever and chills.  She notes she use to shave in her genital region; however, no longer shaves.  Patient denies history of herpes.  Patient admits to a large abscess on her left buttocks region.  Denies IV drug use.  No history of previous abscesses or MRSA infection. Denies vaginal discharge and urinary symptoms.   History obtained from patient and past medical records. No interpreter used during encounter.      Past Medical History:  Diagnosis Date  . Angina pectoris   . Hypertension     There are no problems to display for this patient.   Past Surgical History:  Procedure Laterality Date  . CESAREAN SECTION       OB History   No obstetric history on file.     No family history on file.  Social History   Tobacco Use  . Smoking status: Former Research scientist (life sciences)  . Smokeless tobacco: Never Used  Substance Use Topics  . Alcohol use: No  . Drug use: No    Home Medications Prior to Admission medications   Medication Sig Start Date End Date Taking? Authorizing Provider  acetaminophen (TYLENOL) 500 MG tablet Take 500 mg by mouth every 6 (six) hours as needed for mild pain.    Yes [provider]  nitroGLYCERIN (NITROSTAT) 0.4 MG SL tablet Place 0.4 mg under the tongue every 5 (five) minutes as needed for chest pain.    Yes [provider]  clindamycin (CLEOCIN) 150 MG capsule Take 3 capsules (450 mg total) by mouth 3 (three) times  daily for 5 days. 05/23/19 05/28/19  Suzy Bouchard, PA-C  ferrous sulfate 325 (65 FE) MG tablet Take 1 tablet (325 mg total) by mouth daily. Patient not taking: Reported on 05/23/2019 04/08/14   Al Corpus, PA-C  ibuprofen (ADVIL,MOTRIN) 600 MG tablet Take 1 tablet (600 mg total) by mouth every 6 (six) hours as needed. Patient not taking: Reported on 05/23/2019 01/14/18   Noemi Chapel, MD  methocarbamol (ROBAXIN) 500 MG tablet Take 1 tablet (500 mg total) by mouth 2 (two) times daily as needed for muscle spasms. Patient not taking: Reported on 05/23/2019 01/14/18   Noemi Chapel, MD  metoCLOPramide (REGLAN) 10 MG tablet Take 1 tablet (10 mg total) by mouth every 6 (six) hours as needed for nausea (or headache). Patient not taking: Reported on 3/00/5110 03/23/15   Delora Fuel, MD  potassium chloride (K-DUR) 10 MEQ tablet Take 1 tablet (10 mEq total) by mouth daily. Patient not taking: Reported on 05/23/2019 01/14/18   Noemi Chapel, MD  potassium chloride SA (K-DUR,KLOR-CON) 20 MEQ tablet Take 1 tablet (20 mEq total) by mouth 2 (two) times daily. Patient not taking: Reported on 3/56/7014 02/12/99   Delora Fuel, MD    Allergies    Patient has no known allergies.  Review of Systems   Review of Systems  Constitutional: Negative for chills and fever.  HENT:  Positive for dental problem.   Gastrointestinal: Negative for abdominal pain, diarrhea, nausea and vomiting.  Genitourinary: Positive for genital sores. Negative for dysuria.  Skin: Positive for color change and wound (abscess).  All other systems reviewed and are negative.   Physical Exam Updated Vital Signs BP 119/62 (BP Location: Left Arm)   Pulse 85   Temp 98.5 F (36.9 C) (Oral)   Resp 16   Ht 5' 4"  (1.626 m)   Wt 79.4 kg   LMP 05/15/2019   SpO2 96%   BMI 30.04 kg/m   Physical Exam Vitals and nursing note reviewed. Exam conducted with a chaperone present.  Constitutional:      General: She is not in acute  distress.    Appearance: She is not ill-appearing.  HENT:     Head: Normocephalic.     Mouth/Throat:     Comments: Area of induration over left posterior molar.  No fluctuance.  No drainable abscess.  Tongue in normal position without protrusion.  Normal phonation.  no Trismus.  Poor dentition throughout Eyes:     Pupils: Pupils are equal, round, and reactive to light.  Cardiovascular:     Rate and Rhythm: Normal rate and regular rhythm.     Pulses: Normal pulses.     Heart sounds: Normal heart sounds. No murmur. No friction rub. No gallop.   Pulmonary:     Effort: Pulmonary effort is normal.     Breath sounds: Normal breath sounds.  Abdominal:     General: Abdomen is flat. There is no distension.     Palpations: Abdomen is soft.     Tenderness: There is no abdominal tenderness. There is no guarding or rebound.  Genitourinary:      Comments: 5x5cm area induration on left buttocks with central fluctuance and mild surrounding erythema.  2 small areas of induration outside labia majora.  No fluctuance. Musculoskeletal:     Cervical back: Neck supple.  Skin:    General: Skin is warm and dry.  Neurological:     General: No focal deficit present.     Mental Status: She is alert.     ED Results / Procedures / Treatments   Labs (all labs ordered are listed, but only abnormal results are displayed) Labs Reviewed - No data to display  EKG None  Radiology No results found.  Procedures .Marland KitchenIncision and Drainage  Date/Time: 05/23/2019 9:50 PM Performed by: Suzy Bouchard, PA-C Authorized by: Suzy Bouchard, PA-C   Consent:    Consent obtained:  Verbal   Consent given by:  Patient   Risks discussed:  Bleeding, incomplete drainage, pain and damage to other organs   Alternatives discussed:  No treatment Universal protocol:    Procedure explained and questions answered to patient or proxy's satisfaction: yes     Relevant documents present and verified: yes     Test  results available and properly labeled: yes     Imaging studies available: yes     Required blood products, implants, devices, and special equipment available: yes     Site/side marked: yes     Immediately prior to procedure a time out was called: yes     Patient identity confirmed:  Verbally with patient Location:    Type:  Abscess   Size:  5x5   Location:  Anogenital   Anogenital location:  Gluteal cleft Pre-procedure details:    Skin preparation:  Betadine Anesthesia (see MAR for exact dosages):    Anesthesia method:  Local infiltration   Local anesthetic:  Lidocaine 1% WITH epi Procedure type:    Complexity:  Complex Procedure details:    Incision types:  Single straight   Incision depth:  Subcutaneous   Scalpel blade:  11   Wound management:  Probed and deloculated, irrigated with saline and extensive cleaning   Drainage:  Purulent   Drainage amount:  Moderate   Wound treatment:  Wound left open   Packing materials:  None Post-procedure details:    Patient tolerance of procedure:  Tolerated well, no immediate complications   (including critical care time)  Medications Ordered in ED Medications  lidocaine (PF) (XYLOCAINE) 1 % injection 10 mL (10 mLs Infiltration Given by Other 05/23/19 2139)    ED Course  I have reviewed the triage vital signs and the nursing notes.  Pertinent labs & imaging results that were available during my care of the patient were reviewed by me and considered in my medical decision making (see chart for details).    MDM Rules/Calculators/A&P                     49 year old female presents to the ED due to dental pain and an abscess located on her left buttocks region.  Vitals all within normal limits.  Patient is afebrile, not tachycardic or hypoxic.  No concern for sepsis at this time.  Patient no acute distress and non-ill-appearing.  Area of induration over left upper posterior molar.  No fluctuance.  Poor dentition throughout.  No abscess  appreciated on exam.  No concern for Ludwig's or deep space infection.  5 x 5 cm area of induration with central fluctuance on left buttocks.  Abscess amenable to I&D.  See procedure note above. Abscess was not large enough to warrant packing or drain,  wound recheck in 2 days. Encouraged home warm soaks and flushing.  Mild signs of cellulitis is surrounding skin. Will discharge patient with Clindamycin for dental infection and abscess.  Instructed patient to take over-the-counter ibuprofen or Tylenol as needed for pain. Strict ED precautions discussed with patient. Patient states understanding and agrees to plan. Patient discharged home in no acute distress and stable vitals.  Final Clinical Impression(s) / ED Diagnoses Final diagnoses:  Pain, dental  Gluteal abscess    Rx / DC Orders ED Discharge Orders         Ordered    clindamycin (CLEOCIN) 150 MG capsule  3 times daily     05/23/19 2150           Karie Kirks 05/23/19 2159    Charlesetta Shanks, MD 05/29/19 [phone]

## 2020-02-16 IMAGING — DX DG CHEST 2V
2 series · 2 of 2 positions shown · non-contrast
Comparison: Chest CT 07/25/2016

CLINICAL DATA: Right-sided chest pain and shortness of breath for 1
day.

EXAM:
CHEST - 2 VIEW

[chest pa]
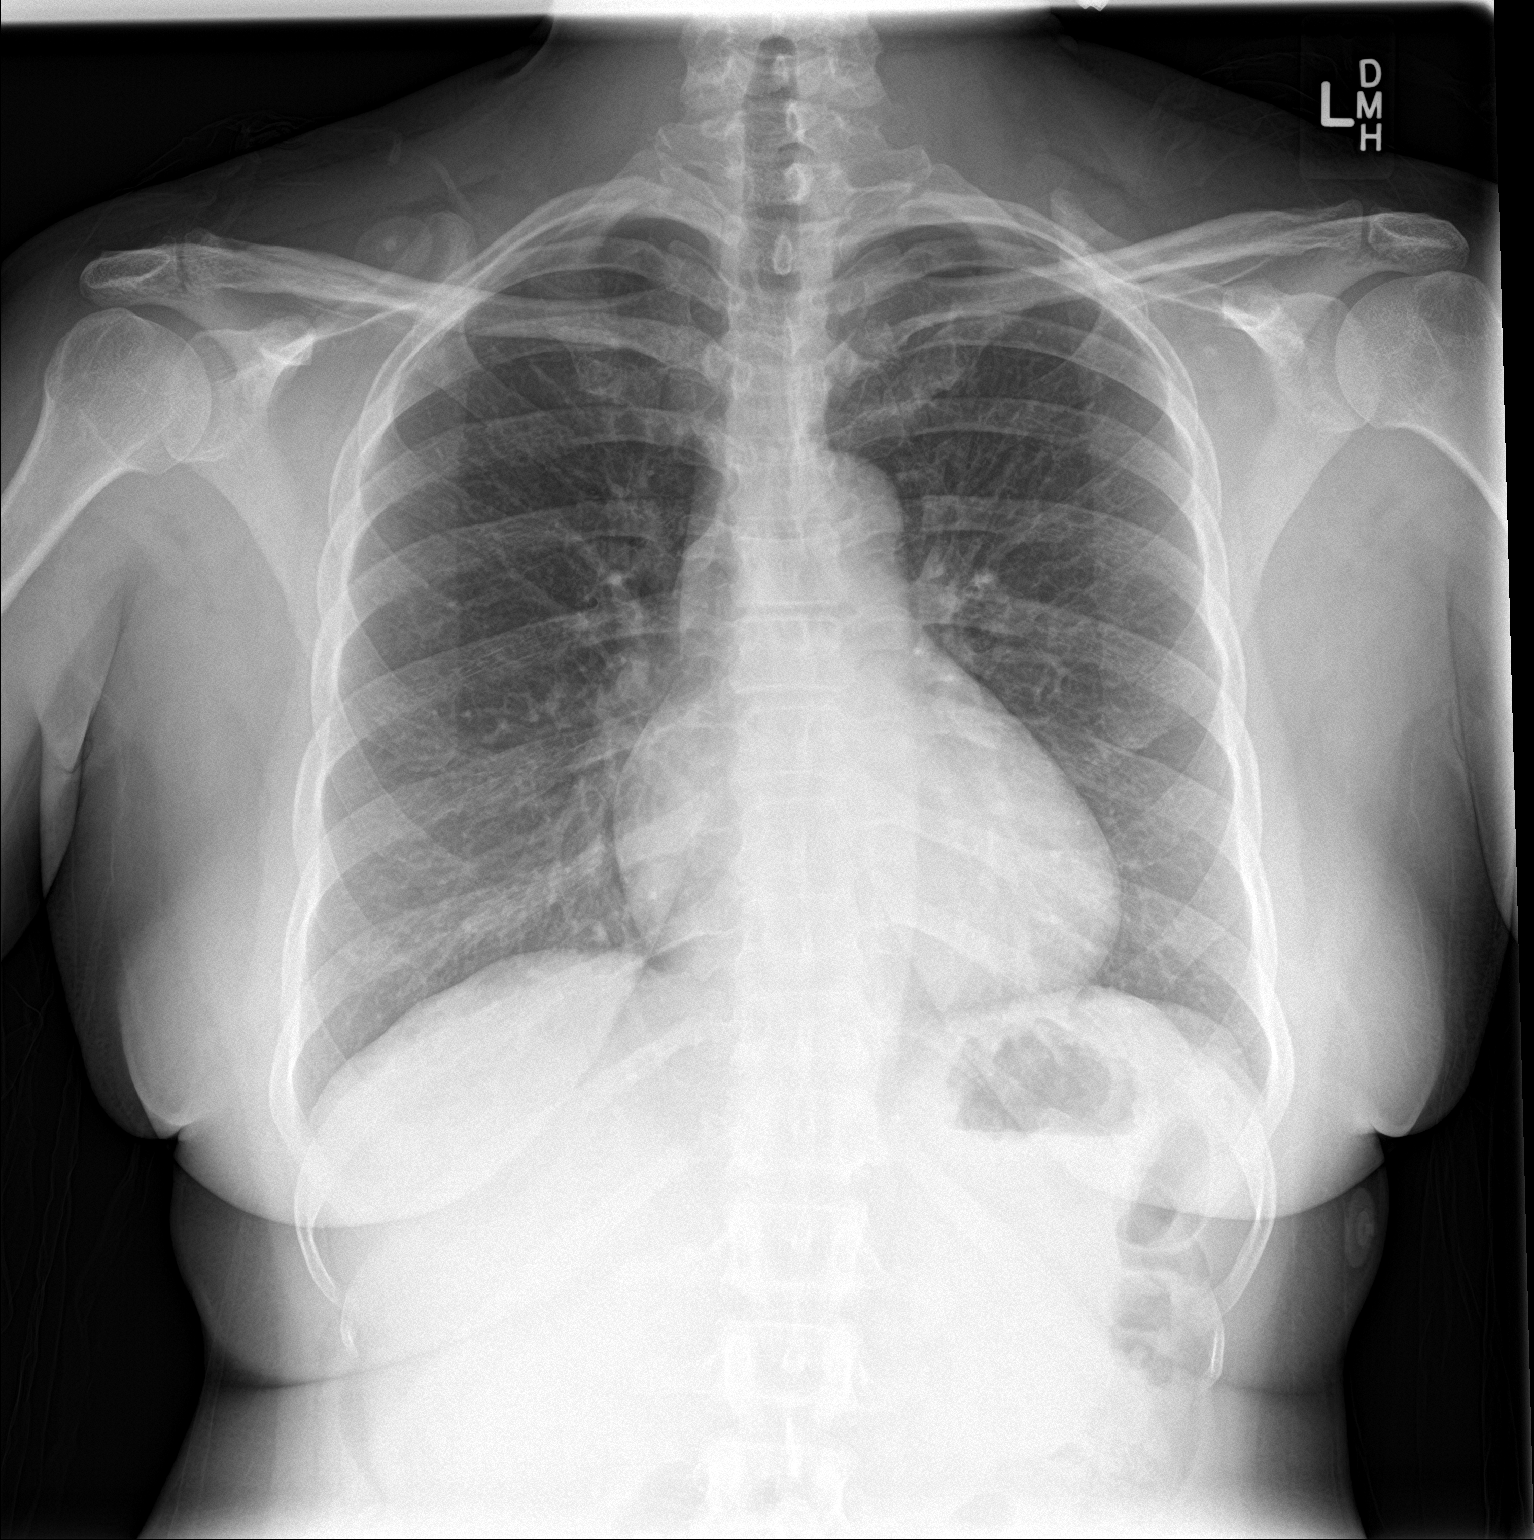

[chest lat]
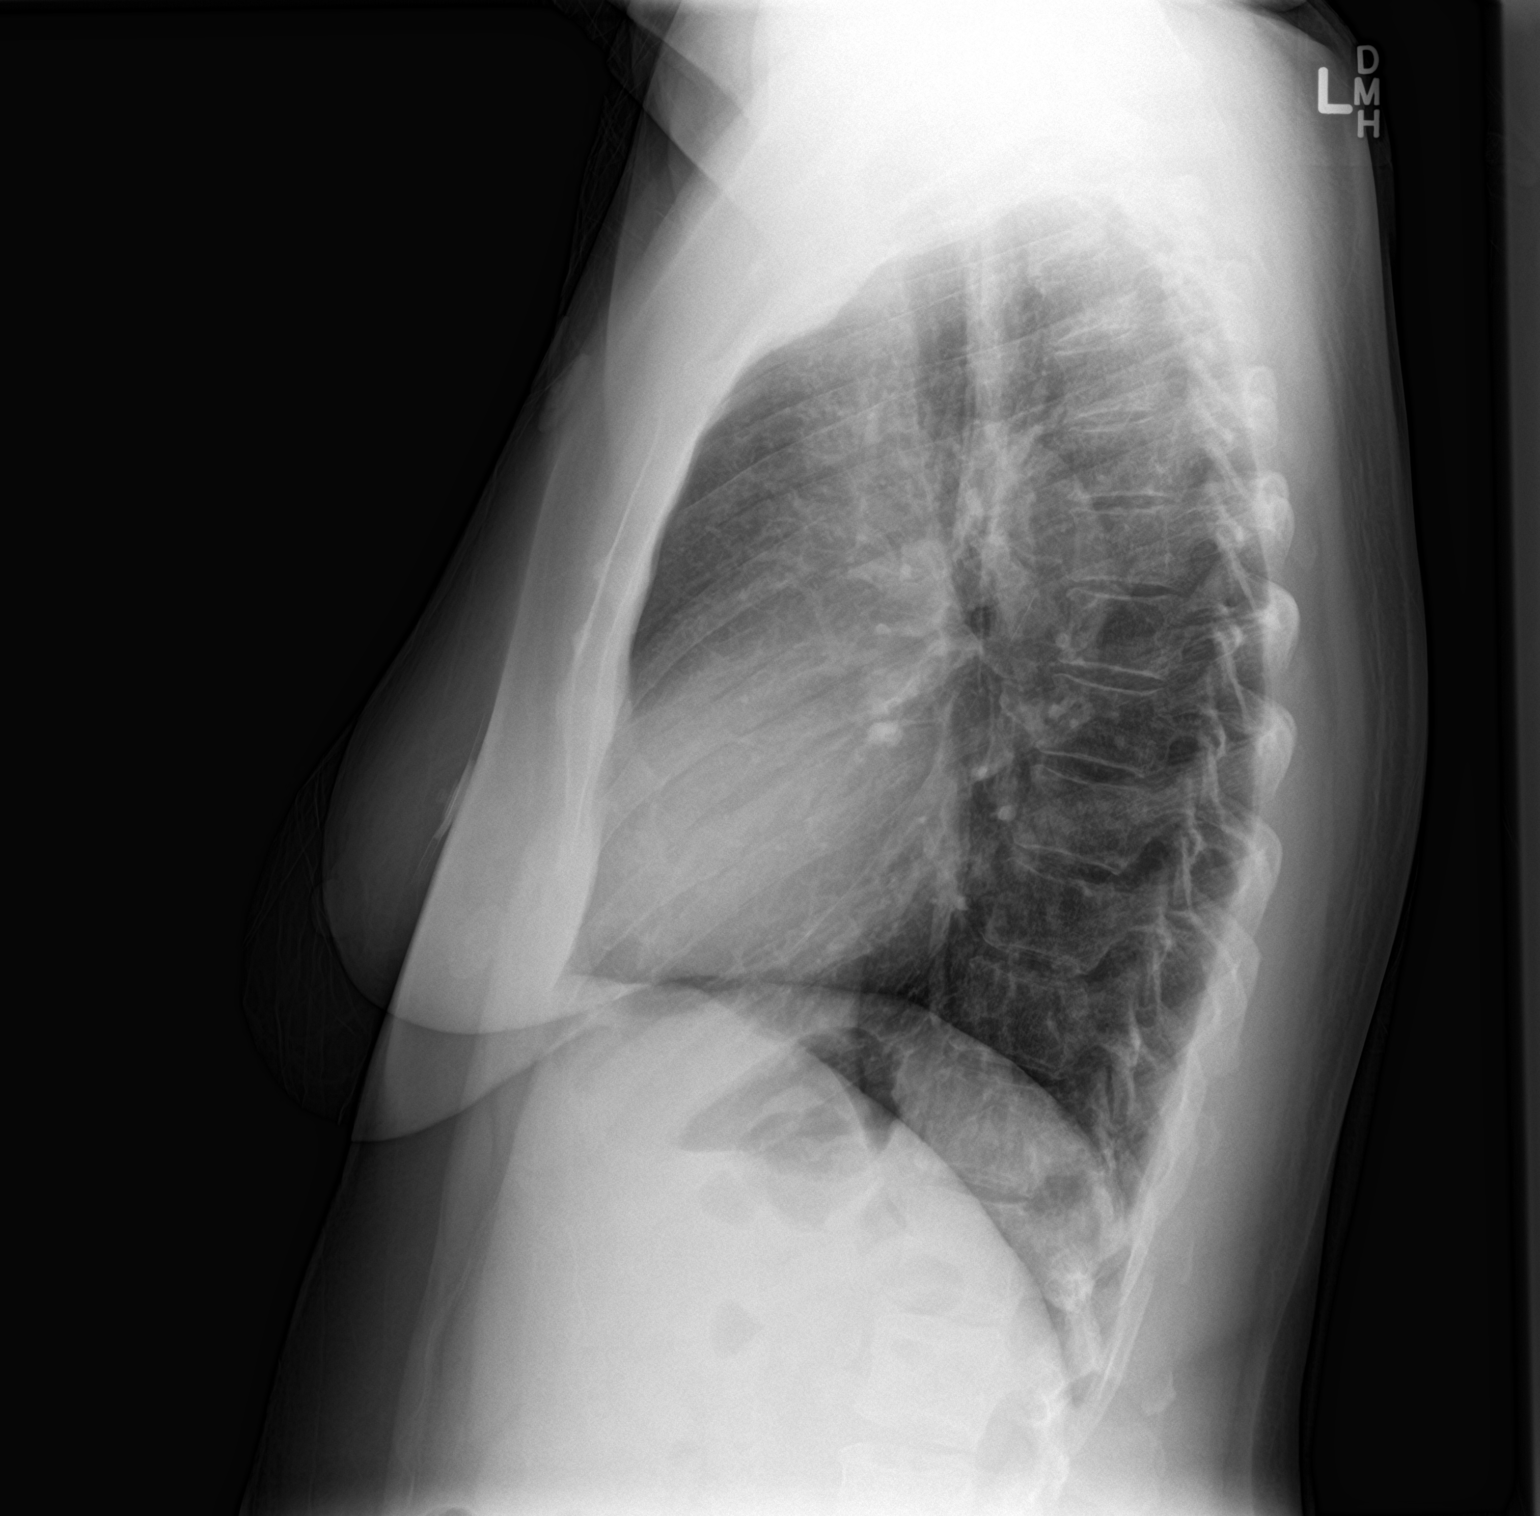

[2 of 2 positions shown; findings below may reference images not displayed]

FINDINGS: Cardiomediastinal silhouette is normal. Mediastinal contours appear
intact.

There is no evidence of focal airspace consolidation, pleural
effusion or pneumothorax.

Osseous structures are without acute abnormality. Soft tissues are
grossly normal.
IMPRESSION: No active cardiopulmonary disease.
